# Patient Record
Sex: Male | Born: 1940 | Race: Black or African American | Hispanic: No | Smoking: Former smoker
Health system: Southern US, Community
[De-identification: ages and names within clinical notes are randomized; demographics above are authoritative.]

## PROBLEM LIST (undated history)

## (undated) DIAGNOSIS — I2510 Atherosclerotic heart disease of native coronary artery without angina pectoris: Secondary | ICD-10-CM

## (undated) DIAGNOSIS — J811 Chronic pulmonary edema: Secondary | ICD-10-CM

## (undated) DIAGNOSIS — I10 Essential (primary) hypertension: Secondary | ICD-10-CM

## (undated) DIAGNOSIS — H538 Other visual disturbances: Secondary | ICD-10-CM

## (undated) DIAGNOSIS — N189 Chronic kidney disease, unspecified: Secondary | ICD-10-CM

## (undated) DIAGNOSIS — I059 Rheumatic mitral valve disease, unspecified: Secondary | ICD-10-CM

## (undated) DIAGNOSIS — C61 Malignant neoplasm of prostate: Secondary | ICD-10-CM

## (undated) DIAGNOSIS — Z8619 Personal history of other infectious and parasitic diseases: Secondary | ICD-10-CM

## (undated) DIAGNOSIS — E119 Type 2 diabetes mellitus without complications: Secondary | ICD-10-CM

## (undated) DIAGNOSIS — I639 Cerebral infarction, unspecified: Secondary | ICD-10-CM

## (undated) DIAGNOSIS — I509 Heart failure, unspecified: Secondary | ICD-10-CM

## (undated) DIAGNOSIS — G4733 Obstructive sleep apnea (adult) (pediatric): Secondary | ICD-10-CM

## (undated) DIAGNOSIS — E785 Hyperlipidemia, unspecified: Secondary | ICD-10-CM

## (undated) DIAGNOSIS — I739 Peripheral vascular disease, unspecified: Secondary | ICD-10-CM

## (undated) HISTORY — DX: Obstructive sleep apnea (adult) (pediatric): G47.33

## (undated) HISTORY — PX: CATARACT EXTRACTION, BILATERAL: SHX1313

---

## 2016-06-13 ENCOUNTER — Encounter (HOSPITAL_BASED_OUTPATIENT_CLINIC_OR_DEPARTMENT_OTHER): Payer: Self-pay | Admitting: *Deleted

## 2016-06-13 ENCOUNTER — Emergency Department (HOSPITAL_BASED_OUTPATIENT_CLINIC_OR_DEPARTMENT_OTHER)
Admission: EM | Admit: 2016-06-13 | Discharge: 2016-06-13 | Disposition: A | Discharge status: Home / Self Care | Payer: PRIVATE HEALTH INSURANCE | Attending: Emergency Medicine | Admitting: Emergency Medicine

## 2016-06-13 DIAGNOSIS — Z79899 Other long term (current) drug therapy: Secondary | ICD-10-CM | POA: Diagnosis not present

## 2016-06-13 DIAGNOSIS — Z7982 Long term (current) use of aspirin: Secondary | ICD-10-CM | POA: Insufficient documentation

## 2016-06-13 DIAGNOSIS — R609 Edema, unspecified: Secondary | ICD-10-CM | POA: Diagnosis not present

## 2016-06-13 DIAGNOSIS — I10 Essential (primary) hypertension: Secondary | ICD-10-CM | POA: Diagnosis not present

## 2016-06-13 DIAGNOSIS — R0602 Shortness of breath: Secondary | ICD-10-CM | POA: Diagnosis present

## 2016-06-13 DIAGNOSIS — Z09 Encounter for follow-up examination after completed treatment for conditions other than malignant neoplasm: Secondary | ICD-10-CM | POA: Diagnosis not present

## 2016-06-13 HISTORY — DX: Hyperlipidemia, unspecified: E78.5

## 2016-06-13 HISTORY — DX: Peripheral vascular disease, unspecified: I73.9

## 2016-06-13 HISTORY — DX: Essential (primary) hypertension: I10

## 2016-06-13 HISTORY — DX: Chronic pulmonary edema: J81.1

## 2016-06-13 LAB — COMPREHENSIVE METABOLIC PANEL
ALK PHOS: 58 U/L (ref 38–126)
ALT: 16 U/L — AB (ref 17–63)
AST: 17 U/L (ref 15–41)
Albumin: 3.2 g/dL — ABNORMAL LOW (ref 3.5–5.0)
Anion gap: 8 (ref 5–15)
BILIRUBIN TOTAL: 0.5 mg/dL (ref 0.3–1.2)
BUN: 36 mg/dL — AB (ref 6–20)
CO2: 27 mmol/L (ref 22–32)
Calcium: 9.3 mg/dL (ref 8.9–10.3)
Chloride: 98 mmol/L — ABNORMAL LOW (ref 101–111)
Creatinine, Ser: 1.5 mg/dL — ABNORMAL HIGH (ref 0.61–1.24)
GFR calc Af Amer: 50 mL/min — ABNORMAL LOW (ref >60)
GFR calc non Af Amer: 43 mL/min — ABNORMAL LOW (ref >60)
GLUCOSE: 487 mg/dL — AB (ref 65–99)
Potassium: 4.9 mmol/L (ref 3.5–5.1)
Sodium: 133 mmol/L — ABNORMAL LOW (ref 135–145)
Total Protein: 7.6 g/dL (ref 6.5–8.1)

## 2016-06-13 LAB — CBC WITH DIFFERENTIAL/PLATELET
Basophils Absolute: 0 10*3/uL (ref 0.0–0.1)
Basophils Relative: 1 %
Eosinophils Absolute: 0.4 10*3/uL (ref 0.0–0.7)
Eosinophils Relative: 10 %
HEMATOCRIT: 35.8 % — AB (ref 39.0–52.0)
HEMOGLOBIN: 11.9 g/dL — AB (ref 13.0–17.0)
LYMPHS ABS: 0.9 10*3/uL (ref 0.7–4.0)
LYMPHS PCT: 20 %
MCH: 26.7 pg (ref 26.0–34.0)
MCHC: 33.2 g/dL (ref 30.0–36.0)
MCV: 80.4 fL (ref 78.0–100.0)
MONOS PCT: 10 %
Monocytes Absolute: 0.4 10*3/uL (ref 0.1–1.0)
NEUTROS ABS: 2.5 10*3/uL (ref 1.7–7.7)
NEUTROS PCT: 59 %
Platelets: 251 10*3/uL (ref 150–400)
RBC: 4.45 MIL/uL (ref 4.22–5.81)
RDW: 16.3 % — ABNORMAL HIGH (ref 11.5–15.5)
WBC: 4.2 10*3/uL (ref 4.0–10.5)

## 2016-06-13 NOTE — ED Provider Notes (Signed)
[facility] Provider Note   CSN: [id number] Arrival date & time: 06/13/16  1151     History   Chief Complaint Chief Complaint  Patient presents with  . Shortness of Breath    HPI Wayne Curtis is a 76 y.o. male.  HPI   In hospital in [place], needed to have follow up after hospitalization but does not have a PCP down here--family called and set appt with Dr. Harlan Stains as PCP but she could not see him until May and they are concerned he has issues to follow up with from hospital.  Recent hospital admission for htn, shortness of breath, fluid build up, not taking medications.  Did catheterization while in hospital that was ok.  Medications have helped with blood pressure, on low sodium, low cholesterol diet.    Family here for follow up as they could not see PCP until May. Report he has been improving at home.  Reports his leg swelling has decreased, he has no shortness of breath, no chest pain, and his mobility is improving. He reportedly has been walking with him, and initially he had difficulty getting around with a walker, but he is also now beginning to walk independently.  They report eating a balanced diet with spinach and fish.     Past Medical History:  Diagnosis Date  . Cancer ([place])   . Hyperlipidemia   . Hypertension   . Peripheral artery disease ([place])   . Pulmonary edema     There are no active problems to display for this patient.   History reviewed. No pertinent surgical history.     Home Medications    Prior to Admission medications   Medication Sig Start Date End Date Taking? Authorizing Provider  aspirin EC 81 MG tablet Take 81 mg by mouth daily.   Yes Historical Provider, MD  atorvastatin (LIPITOR) 40 MG tablet Take 40 mg by mouth daily.   Yes Historical Provider, MD  clopidogrel (PLAVIX) 75 MG tablet Take 75 mg by mouth daily.   Yes Historical Provider, MD  furosemide (LASIX) 40 MG tablet Take 40 mg by mouth.   Yes Historical  Provider, MD  labetalol (NORMODYNE) 200 MG tablet Take 200 mg by mouth 2 (two) times daily.   Yes Historical Provider, MD    Family History No family history on file.  Social History Social History  Substance Use Topics  . Smoking status: Never Smoker  . Smokeless tobacco: Never Used  . Alcohol use No     Allergies   Penicillins   Review of Systems Review of Systems  Constitutional: Negative for fever.  HENT: Negative for sore throat.   Eyes: Negative for visual disturbance.  Respiratory: Negative for shortness of breath.   Cardiovascular: Negative for chest pain.  Gastrointestinal: Negative for abdominal pain, nausea and vomiting.  Genitourinary: Negative for difficulty urinating.  Musculoskeletal: Negative for back pain and neck stiffness.  Skin: Negative for rash.  Neurological: Negative for syncope and headaches.     Physical Exam Updated Vital Signs BP (!) 167/73   Pulse 79   Temp 97.7 F (36.5 C) (Oral)   Resp (!) 22   Ht 5' 11.5" (1.816 m)   Wt 214 lb (97.1 kg)   SpO2 99%   BMI 29.43 kg/m   Physical Exam  Constitutional: He is oriented to person, place, and time. He appears well-developed and well-nourished. No distress.  HENT:  Head: Normocephalic and atraumatic.  Eyes: Conjunctivae and EOM are normal.  Neck: Normal range of motion. No JVD present.  Cardiovascular: Normal rate, regular rhythm, normal heart sounds and intact distal pulses.  Exam reveals no gallop and no friction rub.   No murmur heard. Pulmonary/Chest: Effort normal. No respiratory distress. He has decreased breath sounds (Left lower). He has no wheezes. He has no rales.  Abdominal: Soft. He exhibits no distension. There is no tenderness. There is no guarding.  Musculoskeletal: He exhibits edema (trace).  Neurological: He is alert and oriented to person, place, and time. He has normal strength. No cranial nerve deficit or sensory deficit.  Skin: Skin is warm and dry. He is not  diaphoretic.  Nursing note and vitals reviewed.    ED Treatments / Results  Labs (all labs ordered are listed, but only abnormal results are displayed) Labs Reviewed  CBC WITH DIFFERENTIAL/PLATELET - Abnormal; Notable for the following:       Result Value   Hemoglobin 11.9 (*)    HCT 35.8 (*)    RDW 16.3 (*)    All other components within normal limits  COMPREHENSIVE METABOLIC PANEL - Abnormal; Notable for the following:    Sodium 133 (*)    Chloride 98 (*)    Glucose, Bld 487 (*)    BUN 36 (*)    Creatinine, Ser 1.50 (*)    Albumin 3.2 (*)    ALT 16 (*)    GFR calc non Af Amer 43 (*)    GFR calc Af Amer 50 (*)    All other components within normal limits    EKG  EKG Interpretation  Date/Time:  Monday June 13 2016 12:18:51 EDT Ventricular Rate:  59 PR Interval:    QRS Duration: 149 QT Interval:  444 QTC Calculation: 440 R Axis:   -64 Text Interpretation:  Sinus rhythm Probable left atrial enlargement RBBB and LAFB Left ventricular hypertrophy T wave inversions anterior and infereior leads No prior ECG available Confirmed by [facility] MD, Junie Panning ([id number]) on 06/13/2016 12:48:23 PM      Prior EKG image not available, however EKG reading also reports left axis deviation, right bundle branch block, T-wave abnormalities laterally Radiology No results found.  Procedures Procedures (including critical care time)  Medications Ordered in ED Medications - No data to display   Prior Cr 1.28, 1.46 3/29    Initial Impression / Assessment and Plan / ED Course  I have reviewed the triage vital signs and the nursing notes.  Pertinent labs & imaging results that were available during my care of the patient were reviewed by me and considered in my medical decision making (see chart for details).    76 year old male with a history of hypertension, hyperlipidemia, peripheral artery disease, recent admission on March 20 with discharge on the 29th at [facility] in [place] with  concern for hypertensive emergency, pulmonary edema, rheumatic disease of the mitral valve, hyperlipidemia, peripheral artery disease, type 2 diabetes, acute kidney injury, with admission for diuresis, cardiac evaluation including catheterization who presents with desire for hospital admission follow up.  Family reports they tried to schedule with PCP however were unable to be seen until May, and given instructions to follow up with physician they presented to ED.  Per history, patient is improving since time of discharge from the hospital. It checked labs given new prescription for Lasix, which shows a normal potassium, hyperglycemia without signs of DKA, creatinine of 1.5 which is similar to creatinine at time of discharge on 3/29. Recommend continuing home medications, diet  and rehab at home and following up closely with PCP as scheduled.     Final Clinical Impressions(s) / ED Diagnoses   Final diagnoses:  Hospital discharge follow-up    New Prescriptions Discharge Medication List as of 06/13/2016  1:44 PM       Gareth Morgan, MD 06/13/16 1734

## 2016-06-13 NOTE — ED Triage Notes (Signed)
He was brought from [place] by his daughter a week ago after hospital admission for SOB and weakness. Hx of stroke in 2014.  Here today with SOB, pain in his legs and unable to care for himself.

## 2016-06-13 NOTE — ED Notes (Addendum)
Pt is alert and in no obvious distress. Family reports pt flew here from [place] approx 2 weeks ago.

## 2016-06-29 ENCOUNTER — Ambulatory Visit (INDEPENDENT_AMBULATORY_CARE_PROVIDER_SITE_OTHER): Payer: PRIVATE HEALTH INSURANCE | Admitting: Podiatry

## 2016-06-29 ENCOUNTER — Encounter: Payer: Self-pay | Admitting: Podiatry

## 2016-06-29 VITALS — BP 158/89 | HR 54

## 2016-06-29 DIAGNOSIS — B351 Tinea unguium: Secondary | ICD-10-CM

## 2016-06-29 DIAGNOSIS — E1149 Type 2 diabetes mellitus with other diabetic neurological complication: Secondary | ICD-10-CM

## 2016-06-29 DIAGNOSIS — M79676 Pain in unspecified toe(s): Secondary | ICD-10-CM

## 2016-06-29 NOTE — Progress Notes (Signed)
   Subjective:    Patient ID: Wayne Curtis, male    DOB: 1940/09/14, 76 y.o.   MRN: [id number]  HPI this patient presents the office with chief complaint of ingrowing toenails, both feet. He states that he is diabetic. He states that the nails are painful walking and wearing his shoes. He presents the office  for diabetic exam as well as nail debridement. He presents for preventative foot care services    Review of Systems  All other systems reviewed and are negative.      Objective:   Physical Exam GENERAL APPEARANCE: Alert, conversant. Appropriately groomed. No acute distress.  VASCULAR: Pedal pulses are  palpable at  [facility] and PT bilateral.  Capillary refill time WNL. NEUROLOGIC: sensation is normal to 5.07 monofilament at 5/5 sites bilateral.  Light touch is intact bilateral, Muscle strength normal.  MUSCULOSKELETAL: acceptable muscle strength, tone and stability bilateral.  Intrinsic muscluature intact bilateral.  Rectus appearance of foot and digits noted bilateral.   DERMATOLOGIC: skin color, texture, and turgor are within normal limits.  No preulcerative lesions or ulcers  are seen, no interdigital maceration noted.  No open lesions present.   No drainage noted.  NAILS  Thick disfigured discolored nails both feet.         Assessment & Plan:  Onychomycosis  B/L  Diabetes  IE  Debride nails    RTC 3 months.   Gardiner Barefoot DPM

## 2016-07-07 ENCOUNTER — Encounter (HOSPITAL_BASED_OUTPATIENT_CLINIC_OR_DEPARTMENT_OTHER): Payer: Self-pay | Admitting: *Deleted

## 2016-07-07 ENCOUNTER — Emergency Department (HOSPITAL_BASED_OUTPATIENT_CLINIC_OR_DEPARTMENT_OTHER): Payer: Medicare HMO

## 2016-07-07 ENCOUNTER — Observation Stay (HOSPITAL_BASED_OUTPATIENT_CLINIC_OR_DEPARTMENT_OTHER)
Admission: EM | Admit: 2016-07-07 | Discharge: 2016-07-09 | Disposition: A | Discharge status: Home Health | Payer: Medicare HMO | Attending: Family Medicine | Admitting: Family Medicine

## 2016-07-07 DIAGNOSIS — I2510 Atherosclerotic heart disease of native coronary artery without angina pectoris: Secondary | ICD-10-CM | POA: Diagnosis not present

## 2016-07-07 DIAGNOSIS — R739 Hyperglycemia, unspecified: Secondary | ICD-10-CM

## 2016-07-07 DIAGNOSIS — N183 Chronic kidney disease, stage 3 (moderate): Secondary | ICD-10-CM | POA: Insufficient documentation

## 2016-07-07 DIAGNOSIS — E1165 Type 2 diabetes mellitus with hyperglycemia: Secondary | ICD-10-CM | POA: Insufficient documentation

## 2016-07-07 DIAGNOSIS — E1151 Type 2 diabetes mellitus with diabetic peripheral angiopathy without gangrene: Secondary | ICD-10-CM | POA: Insufficient documentation

## 2016-07-07 DIAGNOSIS — Z794 Long term (current) use of insulin: Secondary | ICD-10-CM | POA: Insufficient documentation

## 2016-07-07 DIAGNOSIS — I130 Hypertensive heart and chronic kidney disease with heart failure and stage 1 through stage 4 chronic kidney disease, or unspecified chronic kidney disease: Secondary | ICD-10-CM | POA: Diagnosis not present

## 2016-07-07 DIAGNOSIS — Z923 Personal history of irradiation: Secondary | ICD-10-CM | POA: Insufficient documentation

## 2016-07-07 DIAGNOSIS — R001 Bradycardia, unspecified: Secondary | ICD-10-CM

## 2016-07-07 DIAGNOSIS — N390 Urinary tract infection, site not specified: Secondary | ICD-10-CM | POA: Diagnosis present

## 2016-07-07 DIAGNOSIS — N3000 Acute cystitis without hematuria: Secondary | ICD-10-CM

## 2016-07-07 DIAGNOSIS — I10 Essential (primary) hypertension: Secondary | ICD-10-CM

## 2016-07-07 DIAGNOSIS — I5022 Chronic systolic (congestive) heart failure: Secondary | ICD-10-CM | POA: Diagnosis not present

## 2016-07-07 DIAGNOSIS — R531 Weakness: Secondary | ICD-10-CM | POA: Insufficient documentation

## 2016-07-07 DIAGNOSIS — B9620 Unspecified Escherichia coli [E. coli] as the cause of diseases classified elsewhere: Secondary | ICD-10-CM | POA: Diagnosis not present

## 2016-07-07 DIAGNOSIS — Z8673 Personal history of transient ischemic attack (TIA), and cerebral infarction without residual deficits: Secondary | ICD-10-CM | POA: Insufficient documentation

## 2016-07-07 DIAGNOSIS — Z8546 Personal history of malignant neoplasm of prostate: Secondary | ICD-10-CM | POA: Insufficient documentation

## 2016-07-07 DIAGNOSIS — E1122 Type 2 diabetes mellitus with diabetic chronic kidney disease: Secondary | ICD-10-CM | POA: Diagnosis not present

## 2016-07-07 DIAGNOSIS — Z9114 Patient's other noncompliance with medication regimen: Secondary | ICD-10-CM | POA: Insufficient documentation

## 2016-07-07 DIAGNOSIS — I4510 Unspecified right bundle-branch block: Secondary | ICD-10-CM | POA: Diagnosis not present

## 2016-07-07 DIAGNOSIS — H8193 Unspecified disorder of vestibular function, bilateral: Secondary | ICD-10-CM | POA: Diagnosis not present

## 2016-07-07 DIAGNOSIS — R42 Dizziness and giddiness: Secondary | ICD-10-CM | POA: Diagnosis not present

## 2016-07-07 DIAGNOSIS — E440 Moderate protein-calorie malnutrition: Secondary | ICD-10-CM | POA: Insufficient documentation

## 2016-07-07 HISTORY — DX: Rheumatic mitral valve disease, unspecified: I05.9

## 2016-07-07 HISTORY — DX: Malignant neoplasm of prostate: C61

## 2016-07-07 HISTORY — DX: Heart failure, unspecified: I50.9

## 2016-07-07 HISTORY — DX: Other visual disturbances: H53.8

## 2016-07-07 HISTORY — DX: Personal history of other infectious and parasitic diseases: Z86.19

## 2016-07-07 HISTORY — DX: Cerebral infarction, unspecified: I63.9

## 2016-07-07 HISTORY — DX: Chronic kidney disease, unspecified: N18.9

## 2016-07-07 HISTORY — DX: Type 2 diabetes mellitus without complications: E11.9

## 2016-07-07 HISTORY — DX: Atherosclerotic heart disease of native coronary artery without angina pectoris: I25.10

## 2016-07-07 LAB — BASIC METABOLIC PANEL
Anion gap: 8 (ref 5–15)
BUN: 28 mg/dL — ABNORMAL HIGH (ref 6–20)
CHLORIDE: 100 mmol/L — AB (ref 101–111)
CO2: 26 mmol/L (ref 22–32)
Calcium: 9.6 mg/dL (ref 8.9–10.3)
Creatinine, Ser: 1.39 mg/dL — ABNORMAL HIGH (ref 0.61–1.24)
GFR, EST AFRICAN AMERICAN: 55 mL/min — AB (ref >60)
GFR, EST NON AFRICAN AMERICAN: 48 mL/min — AB (ref >60)
Glucose, Bld: 175 mg/dL — ABNORMAL HIGH (ref 65–99)
Potassium: 3.9 mmol/L (ref 3.5–5.1)
SODIUM: 134 mmol/L — AB (ref 135–145)

## 2016-07-07 LAB — CBC WITH DIFFERENTIAL/PLATELET
BASOS ABS: 0 10*3/uL (ref 0.0–0.1)
Basophils Relative: 1 %
Eosinophils Absolute: 0.5 10*3/uL (ref 0.0–0.7)
Eosinophils Relative: 10 %
HCT: 37.6 % — ABNORMAL LOW (ref 39.0–52.0)
Hemoglobin: 12.4 g/dL — ABNORMAL LOW (ref 13.0–17.0)
LYMPHS PCT: 21 %
Lymphs Abs: 1 10*3/uL (ref 0.7–4.0)
MCH: 26.6 pg (ref 26.0–34.0)
MCHC: 33 g/dL (ref 30.0–36.0)
MCV: 80.7 fL (ref 78.0–100.0)
Monocytes Absolute: 0.4 10*3/uL (ref 0.1–1.0)
Monocytes Relative: 8 %
NEUTROS ABS: 2.9 10*3/uL (ref 1.7–7.7)
Neutrophils Relative %: 60 %
PLATELETS: 229 10*3/uL (ref 150–400)
RBC: 4.66 MIL/uL (ref 4.22–5.81)
RDW: 16.3 % — AB (ref 11.5–15.5)
WBC: 4.9 10*3/uL (ref 4.0–10.5)

## 2016-07-07 LAB — URINALYSIS, ROUTINE W REFLEX MICROSCOPIC
BILIRUBIN URINE: NEGATIVE
Glucose, UA: 250 mg/dL — AB
KETONES UR: NEGATIVE mg/dL
NITRITE: POSITIVE — AB
Protein, ur: NEGATIVE mg/dL
Specific Gravity, Urine: 1.011 (ref 1.005–1.030)
pH: 5 (ref 5.0–8.0)

## 2016-07-07 LAB — URINALYSIS, MICROSCOPIC (REFLEX)

## 2016-07-07 LAB — CBG MONITORING, ED: Glucose-Capillary: 291 mg/dL — ABNORMAL HIGH (ref 65–99)

## 2016-07-07 LAB — GLUCOSE, CAPILLARY: GLUCOSE-CAPILLARY: 228 mg/dL — AB (ref 65–99)

## 2016-07-07 LAB — TROPONIN I

## 2016-07-07 MED ORDER — INSULIN ASPART 100 UNIT/ML ~~LOC~~ SOLN
0.0000 [IU] | Freq: Three times a day (TID) | SUBCUTANEOUS | Status: DC
  Administered 2016-07-08: 3 [IU] via SUBCUTANEOUS
  Administered 2016-07-08: 5 [IU] via SUBCUTANEOUS
  Administered 2016-07-08: 11 [IU] via SUBCUTANEOUS
  Administered 2016-07-09: 3 [IU] via SUBCUTANEOUS
  Administered 2016-07-09: 8 [IU] via SUBCUTANEOUS

## 2016-07-07 MED ORDER — ENSURE ENLIVE PO LIQD
237.0000 mL | Freq: Two times a day (BID) | ORAL | Status: DC
  Administered 2016-07-08: 237 mL via ORAL

## 2016-07-07 MED ORDER — CEPHALEXIN 250 MG PO CAPS: 500.0000 mg | ORAL_CAPSULE | Freq: Once | ORAL | Status: DC

## 2016-07-07 MED ORDER — CIPROFLOXACIN IN D5W 400 MG/200ML IV SOLN
400.0000 mg | Freq: Two times a day (BID) | INTRAVENOUS | Status: DC
  Administered 2016-07-07 – 2016-07-09 (×4): 400 mg via INTRAVENOUS
  Filled 2016-07-07 (×4): qty 200

## 2016-07-07 MED ORDER — ONDANSETRON HCL 4 MG PO TABS: 4.0000 mg | ORAL_TABLET | Freq: Four times a day (QID) | ORAL | Status: DC | PRN

## 2016-07-07 MED ORDER — LABETALOL HCL 200 MG PO TABS
200.0000 mg | ORAL_TABLET | Freq: Three times a day (TID) | ORAL | Status: DC
  Administered 2016-07-07: 200 mg via ORAL
  Filled 2016-07-07: qty 1

## 2016-07-07 MED ORDER — ACETAMINOPHEN 325 MG PO TABS: 650.0000 mg | ORAL_TABLET | Freq: Four times a day (QID) | ORAL | Status: DC | PRN

## 2016-07-07 MED ORDER — FUROSEMIDE 40 MG PO TABS
40.0000 mg | ORAL_TABLET | Freq: Every day | ORAL | Status: DC
  Administered 2016-07-08 – 2016-07-09 (×2): 40 mg via ORAL
  Filled 2016-07-07 (×2): qty 1

## 2016-07-07 MED ORDER — INSULIN GLARGINE 100 UNIT/ML ~~LOC~~ SOLN
10.0000 [IU] | Freq: Every day | SUBCUTANEOUS | Status: DC
  Administered 2016-07-07 – 2016-07-08 (×2): 10 [IU] via SUBCUTANEOUS
  Filled 2016-07-07 (×3): qty 0.1

## 2016-07-07 MED ORDER — ONDANSETRON HCL 4 MG/2ML IJ SOLN: 4.0000 mg | Freq: Four times a day (QID) | INTRAMUSCULAR | Status: DC | PRN

## 2016-07-07 MED ORDER — ATORVASTATIN CALCIUM 40 MG PO TABS
40.0000 mg | ORAL_TABLET | Freq: Every day | ORAL | Status: DC
  Administered 2016-07-07 – 2016-07-08 (×2): 40 mg via ORAL
  Filled 2016-07-07 (×2): qty 1

## 2016-07-07 MED ORDER — SODIUM CHLORIDE 0.9 % IV BOLUS (SEPSIS)
1000.0000 mL | Freq: Once | INTRAVENOUS | Status: AC
  Administered 2016-07-07: 1000 mL via INTRAVENOUS

## 2016-07-07 MED ORDER — SODIUM CHLORIDE 0.9 % IV SOLN
Freq: Once | INTRAVENOUS | Status: AC
  Administered 2016-07-07: 20:00:00 via INTRAVENOUS

## 2016-07-07 MED ORDER — CLOPIDOGREL BISULFATE 75 MG PO TABS
75.0000 mg | ORAL_TABLET | Freq: Every day | ORAL | Status: DC
  Administered 2016-07-08 – 2016-07-09 (×2): 75 mg via ORAL
  Filled 2016-07-07 (×2): qty 1

## 2016-07-07 MED ORDER — SODIUM CHLORIDE 0.9% FLUSH
3.0000 mL | Freq: Two times a day (BID) | INTRAVENOUS | Status: DC
  Administered 2016-07-08 – 2016-07-09 (×3): 3 mL via INTRAVENOUS

## 2016-07-07 MED ORDER — ACETAMINOPHEN 650 MG RE SUPP: 650.0000 mg | Freq: Four times a day (QID) | RECTAL | Status: DC | PRN

## 2016-07-07 MED ORDER — ASPIRIN EC 81 MG PO TBEC
81.0000 mg | DELAYED_RELEASE_TABLET | Freq: Every day | ORAL | Status: DC
  Administered 2016-07-08 – 2016-07-09 (×2): 81 mg via ORAL
  Filled 2016-07-07 (×2): qty 1

## 2016-07-07 MED ORDER — INSULIN ASPART 100 UNIT/ML ~~LOC~~ SOLN
0.0000 [IU] | Freq: Every day | SUBCUTANEOUS | Status: DC
  Administered 2016-07-07: 2 [IU] via SUBCUTANEOUS

## 2016-07-07 NOTE — ED Triage Notes (Signed)
Dizziness for a week. Worse today. Family states they did not know to give him Insulin. He has been without for about 3 weeks.

## 2016-07-07 NOTE — ED Notes (Signed)
Called [phone] to give report with no answer

## 2016-07-07 NOTE — ED Provider Notes (Signed)
[place] DEPT [facility] Provider Note   CSN: [id number] Arrival date & time: 07/07/16  1109     History   Chief Complaint Chief Complaint  Patient presents with  . Hyperglycemia    HPI Wayne Curtis is a 76 y.o. male who presents with dizziness, feeling off balance, and hyperglycemia. PMH significant for hx of CVA, CHF EF 50%, hx of prostate cancer, HLD, HTN, PAD s/p stents, CKD. Daughter is at bedside and helps provide history. The patient states he has felt intermittently dizzy and off balance for the past week. The dizziness has been mild up until yesterday when it started becoming more noticeable and he has almost had falls. He also has intermittent weakness alternating on the left side and right side. He dropped a plate this morning however he also has chronic right shoulder pain and weakness. The patient reports generalized weakness and fatigued. Daughter states that he was supposed to have physical therapy after he was released from the hospital in [place] however patient has a low motivation to do this and wants to lie in bed. Daughter also notes that she has been giving him all his PO medicines however they did not know he was on insulin so he has not taken this for the past 3 weeks. He also has mild shortness of breath but denies significant shortness of breath or leg swelling. He took his last dose of Lasix yesterday morning but has not taken it today. No fever or recent illnesses, URI symptoms, cough, chest pain, abdominal pain, nausea, vomiting, diarrhea. He has chronic issues with incontinence. He denies any leg pain or his legs giving out on him when he is walking. He does not feel lightheaded or like he is going to pass out. They have their first appointment with Dr. Dema Severin on May 11.  HPI  Past Medical History:  Diagnosis Date  . CHF (congestive heart failure) ([place])   . CKD (chronic kidney disease)   . Hyperlipidemia   . Hypertension   . Peripheral artery disease ([place])   .  Prostate cancer ([place])   . Pulmonary edema     There are no active problems to display for this patient.   History reviewed. No pertinent surgical history.     Home Medications    Prior to Admission medications   Medication Sig Start Date End Date Taking? Authorizing Provider  Insulin Aspart (NOVOLOG FLEXPEN Christopher) Inject into the skin.   Yes Historical Provider, MD  insulin glargine (LANTUS) 100 UNIT/ML injection Inject into the skin at bedtime.   Yes Historical Provider, MD  aspirin EC 81 MG tablet Take 81 mg by mouth daily.    Historical Provider, MD  atorvastatin (LIPITOR) 40 MG tablet Take 40 mg by mouth daily.    Historical Provider, MD  clopidogrel (PLAVIX) 75 MG tablet Take 75 mg by mouth daily.    Historical Provider, MD  furosemide (LASIX) 40 MG tablet Take 40 mg by mouth.    Historical Provider, MD  labetalol (NORMODYNE) 200 MG tablet Take 200 mg by mouth 2 (two) times daily.    Historical Provider, MD    Family History No family history on file.  Social History Social History  Substance Use Topics  . Smoking status: Never Smoker  . Smokeless tobacco: Never Used  . Alcohol use No     Allergies   Penicillins   Review of Systems Review of Systems  Constitutional: Positive for activity change and fatigue. Negative for fever.  Eyes:  Negative for visual disturbance.  Respiratory: Positive for shortness of breath. Negative for cough.   Cardiovascular: Negative for chest pain and leg swelling.  Gastrointestinal: Negative for abdominal pain, diarrhea, nausea and vomiting.  Genitourinary: Positive for enuresis. Negative for dysuria.  Musculoskeletal: Positive for arthralgias and gait problem.  Neurological: Positive for dizziness, weakness and headaches. Negative for syncope, light-headedness and numbness.  Psychiatric/Behavioral: Negative for confusion.  All other systems reviewed and are negative.    Physical Exam Updated Vital Signs BP (!) 141/64 (BP  Location: Right Arm)   Pulse 64   Temp 97.8 F (36.6 C)   Resp 16   Ht 5' 11.5" (1.816 m)   Wt 96.2 kg   SpO2 95%   BMI 29.16 kg/m   Physical Exam  Constitutional: He is oriented to person, place, and time. He appears well-developed and well-nourished. No distress.  Calm, cooperative, no acute distress  HENT:  Head: Normocephalic and atraumatic.  Right Ear: Hearing, tympanic membrane, external ear and ear canal normal.  Left Ear: Hearing, tympanic membrane, external ear and ear canal normal.  Tenderness behind right ear  Eyes: Conjunctivae are normal. Pupils are equal, round, and reactive to light. Right eye exhibits no discharge. Left eye exhibits no discharge. No scleral icterus.  Neck: Normal range of motion.  Cardiovascular: Normal rate and regular rhythm.  Exam reveals no gallop and no friction rub.   No murmur heard. Pulmonary/Chest: Effort normal and breath sounds normal. No respiratory distress. He has no wheezes. He has no rales. He exhibits no tenderness.  Abdominal: Soft. Bowel sounds are normal. He exhibits no distension and no mass. There is no tenderness. There is no rebound and no guarding. No hernia.  Musculoskeletal:  No leg swelling  Neurological: He is alert and oriented to person, place, and time.  Mental Status:  Alert, oriented, thought content appropriate, able to give a coherent history. Speech fluent without evidence of aphasia. Able to follow 2 step commands without difficulty.  Cranial Nerves:  II:  Peripheral visual fields grossly normal, pupils equal, round, reactive to light III,IV, VI: ptosis not present, extra-ocular motions intact bilaterally  V,VII: smile symmetric, facial light touch sensation equal VIII: hearing grossly normal to voice  X: uvula elevates symmetrically  XI: bilateral shoulder shrug symmetric and strong XII: midline tongue extension without fassiculations Motor:  Normal tone. 5/5 in upper and lower extremities bilaterally  including strong and equal grip strength and dorsiflexion/plantar flexion. Slightly weaker in the right upper extremity due to shoulder pain. Sensory: Pinprick and light touch normal in all extremities.  Cerebellar: normal finger-to-nose with bilateral upper extremities Gait: Unsteady  CV: distal pulses palpable throughout    Skin: Skin is warm and dry.  Psychiatric: He has a normal mood and affect. His behavior is normal.  Nursing note and vitals reviewed.    ED Treatments / Results  Labs (all labs ordered are listed, but only abnormal results are displayed) Labs Reviewed  BASIC METABOLIC PANEL - Abnormal; Notable for the following:       Result Value   Sodium 134 (*)    Chloride 100 (*)    Glucose, Bld 175 (*)    BUN 28 (*)    Creatinine, Ser 1.39 (*)    GFR calc non Af Amer 48 (*)    GFR calc Af Amer 55 (*)    All other components within normal limits  CBC WITH DIFFERENTIAL/PLATELET - Abnormal; Notable for the following:    Hemoglobin 12.4 (*)  HCT 37.6 (*)    RDW 16.3 (*)    All other components within normal limits  URINALYSIS, ROUTINE W REFLEX MICROSCOPIC - Abnormal; Notable for the following:    APPearance CLOUDY (*)    Glucose, UA 250 (*)    Hgb urine dipstick TRACE (*)    Nitrite POSITIVE (*)    Leukocytes, UA LARGE (*)    All other components within normal limits  URINALYSIS, MICROSCOPIC (REFLEX) - Abnormal; Notable for the following:    Bacteria, UA MANY (*)    Squamous Epithelial / LPF 0-5 (*)    All other components within normal limits  CBG MONITORING, ED - Abnormal; Notable for the following:    Glucose-Capillary 291 (*)    All other components within normal limits  URINE CULTURE  TROPONIN I    EKG  EKG Interpretation  Date/Time:  Thursday Jul 07 2016 14:06:15 EDT Ventricular Rate:  55 PR Interval:    QRS Duration: 156 QT Interval:  477 QTC Calculation: 457 R Axis:   -54 Text Interpretation:  Sinus rhythm Right bundle branch block LVH with  IVCD and secondary repol abnrm No significant change since last tracing Confirmed by [facility] MD, JULIE ([id number]) on 07/07/2016 2:27:33 PM Also confirmed by [facility] MD, JULIE ([id number]), editor Verna Czech [phone])  on 07/07/2016 2:28:54 PM       Radiology Dg Chest 2 View  Result Date: 07/07/2016 CLINICAL DATA:  Dizziness x3wks. Has been w/o insulin same time period. Pt states having problems w/balance. EXAM: CHEST  2 VIEW COMPARISON:  None. FINDINGS: Cardiac silhouette is normal in size. No mediastinal or hilar masses. No evidence of adenopathy. Clear lungs.  No pleural effusion or pneumothorax. Skeletal structures are demineralized but intact. A loop recorder lies in the left anterior chest wall. IMPRESSION: No acute cardiopulmonary disease. Electronically Signed   By: Lajean Manes M.D.   On: 07/07/2016 12:42   Ct Head Wo Contrast  Result Date: 07/07/2016 CLINICAL DATA:  Dizziness for 3 weeks. EXAM: CT HEAD WITHOUT CONTRAST TECHNIQUE: Contiguous axial images were obtained from the base of the skull through the vertex without intravenous contrast. COMPARISON:  None. FINDINGS: Brain: There is cortical atrophy and extensive chronic microvascular ischemic change. No evidence of acute intracranial abnormality including hemorrhage, infarct, mass lesion, mass effect, midline shift or abnormal extra-axial fluid collection. No hydrocephalus or pneumocephalus. Vascular: Extensive atherosclerotic vascular disease is identified. Skull: Intact. Sinuses/Orbits: Negative. Other: None. IMPRESSION: No acute abnormality. Atrophy and extensive chronic microvascular ischemic change. Atherosclerosis. Electronically Signed   By: Inge Rise M.D.   On: 07/07/2016 12:41    Procedures Procedures (including critical care time)  Medications Ordered in ED Medications  ciprofloxacin (CIPRO) IVPB 400 mg (400 mg Intravenous New Bag/Given 07/07/16 1542)  sodium chloride 0.9 % bolus 1,000 mL (0 mLs Intravenous Stopped 07/07/16  1521)     Initial Impression / Assessment and Plan / ED Course  I have reviewed the triage vital signs and the nursing notes.  Pertinent labs & imaging results that were available during my care of the patient were reviewed by me and considered in my medical decision making (see chart for details).  76 year old male with multiple medical problems presents with dizziness, confusion, and difficulty walking. CBC remarkable for mild anemia. BMP remarkable for mild hyponatremia and hypochloremia, hyperglycemia (175), and elevated SCr which is improved from last month. UA shows UTI with many bacteria, trace hgb, large leukocytes, positive nitrites, and TNTC WBC. Culture sent. EKG  was NSR and trop was 0. CXR negative. CT negative. Fluids and antibiotics started. Will admit to hospitalist for his AMS and UTI. Shared visit with Dr. Gilford Raid. Appreciated assistance from Dr. Cruzita Lederer.   Final Clinical Impressions(s) / ED Diagnoses   Final diagnoses:  Disequilibrium  Hypertension, unspecified type  Hyperglycemia  Acute cystitis without hematuria    New Prescriptions New Prescriptions   No medications on file     Recardo Evangelist, PA-C 07/07/16 [place], Julie, MD 07/14/16 [phone]

## 2016-07-07 NOTE — H&P (Signed)
History and Physical    Wayne Curtis KZS:[id number] DOB: 09/11/1940 DOA: 07/07/2016  PCP: Vidal Schwalbe, MD (Has not seen yet; Appointment May 11th) Former PCP in [place]: Dr. Elenora Gamma [phone] Last admitted to [facility]  Patient coming from: [facility]  Chief Complaint: Dizziness  HPI: Wayne Curtis is a 76 y.o. gentleman with a history of prostate cancer S/P radiation in 2014, CVA in 2014 (he denies residual deficits), PAD S/P bilateral LE stents, CAD, HTN, HLD, IDDM, and chronic systolic heart failure (Last EF by TTE 50%, 35-40% by TEE; likely nonischemic) who relocated to the [place] area to live with his daughter approximately three weeks ago.  He was admitted to the hospital in [place] in March for accelerated HTN with pulmonary edema and acute CHF.  His daughter brought him to [facility] after that to help him take care of himself.  He has an upcoming appointment with a new PCP next week.  He has been in his baseline state of health until three days ago.  He has had progressive weakness, dizziness, and disequilibrium.  His blood sugars and blood pressures have also been elevated.  No falls.  No vertigo.  No dysuria.  He has had chronic urinary urgency since his prostate treatment.  No acute changes in vision.  No chest pain or shortness of breath.  No abdominal pain, nausea, or vomiting.  Appetite has been normal, but he has lost 13 lbs in the past month.  Of note, the patient's daughter has several pages of medical records, which were reviewed by me at the bedside.  ED Course: U/A is consistent with infection with positive nitrites, large leukocytes, TNTC WBC, and many bacteria.  Urine culture sent.  Normal WBC count.  Sodium 134.  Chloride 100.  BUN 28.  Creatinine 1.3.  Troponin negative.   Head CT negative for acute process.  Chest xray negative for acute process.  He received a 1L NS and IV cipro.  Hospitalist asked to admit.  Review of Systems: As per HPI otherwise  10 systems reviewed and negative.   Past Medical History:  Diagnosis Date  . Blurred vision, left eye    Chronic  . CHF (congestive heart failure) ([place])   . Chronic kidney disease, stage 2 (mild)   . CKD (chronic kidney disease)   . Coronary artery disease   . Diabetes mellitus without complication ([place])   . History of shingles   . Hyperlipidemia   . Hypertension   . Peripheral artery disease ([place])   . Prostate cancer ([place])   . Prostate cancer ([place])   . Pulmonary edema   . Rheumatic mitral valve disease   . Stroke [facility])     Past Surgical History:  Procedure Laterality Date  . CATARACT EXTRACTION, BILATERAL       reports that he has never smoked. He has never used smokeless tobacco. He reports that he does not drink alcohol. His drug history is not on file. No illicit drug use. He is single.  Allergies  Allergen Reactions  . Penicillins     Family History  Problem Relation Age of Onset  . Pancreatic cancer Sister   . Stomach cancer Brother   . Benign prostatic hyperplasia Brother   . Throat cancer Brother      Prior to Admission medications   Medication Sig Start Date End Date Taking? Authorizing Provider  aspirin EC 81 MG tablet Take 81 mg by mouth daily at 12 noon.  Yes Historical Provider, MD  atorvastatin (LIPITOR) 40 MG tablet Take 40 mg by mouth at bedtime.    Yes Historical Provider, MD  clopidogrel (PLAVIX) 75 MG tablet Take 75 mg by mouth daily.   Yes Historical Provider, MD  furosemide (LASIX) 40 MG tablet Take 40 mg by mouth daily.    Yes Historical Provider, MD  Insulin Aspart (NOVOLOG FLEXPEN [place]) Inject into the skin 3 (three) times daily before meals.    Yes Historical Provider, MD  insulin glargine (LANTUS) 100 UNIT/ML injection Inject into the skin at bedtime.   Yes Historical Provider, MD  labetalol (NORMODYNE) 200 MG tablet Take 200 mg by mouth 3 (three) times daily.     Historical Provider, MD    Physical Exam: Vitals:   07/07/16 1552  07/07/16 1700 07/07/16 1825 07/07/16 2021  BP: (!) 164/64 (!) 163/64 (!) 175/79 (!) 164/45  Pulse: (!) 49 (!) 51 (!) 56   Resp: 19 17  20   Temp:   97.9 F (36.6 C) 97.6 F (36.4 C)  TempSrc:   Oral Oral  SpO2: 98% 99%  100%  Weight:   90.4 kg (199 lb 4.7 oz)   Height:   5' 11.5" (1.816 m)       Constitutional: NAD, calm, comfortable Vitals:   07/07/16 1552 07/07/16 1700 07/07/16 1825 07/07/16 2021  BP: (!) 164/64 (!) 163/64 (!) 175/79 (!) 164/45  Pulse: (!) 49 (!) 51 (!) 56   Resp: 19 17  20   Temp:   97.9 F (36.6 C) 97.6 F (36.4 C)  TempSrc:   Oral Oral  SpO2: 98% 99%  100%  Weight:   90.4 kg (199 lb 4.7 oz)   Height:   5' 11.5" (1.816 m)    Eyes: PERRL, lids and conjunctivae normal ENMT: Mucous membranes are moist. Posterior pharynx clear of any exudate or lesions. Normal dentition.  Neck: normal appearance, supple, no masses Respiratory: clear to auscultation bilaterally, no wheezing, no crackles. Normal respiratory effort. No accessory muscle use.  Cardiovascular: Normal rate, regular rhythm, no murmurs / rubs / gallops. No extremity edema. 2+ posterior tibial pulses. No carotid bruits.  GI: abdomen is soft and compressible.  No distention.  No tenderness.  Bowel sounds are present. Musculoskeletal:  No joint deformity in upper and lower extremities. Good ROM, no contractures. Normal muscle tone.  No CVA tenderness bilaterally. Skin: no rashes, warm and dry Neurologic: CN 2-12 grossly intact. Sensation intact, Strength symmetric bilaterally, 5/5. Psychiatric: Normal judgment and insight. Alert and oriented x 3. Normal mood.     Labs on Admission: I have personally reviewed following labs and imaging studies  CBC:  Recent Labs Lab 07/07/16 1241  WBC 4.9  NEUTROABS 2.9  HGB 12.4*  HCT 37.6*  MCV 80.7  PLT 193   Basic Metabolic Panel:  Recent Labs Lab 07/07/16 1241  NA 134*  K 3.9  CL 100*  CO2 26  GLUCOSE 175*  BUN 28*  CREATININE 1.39*  CALCIUM  9.6   GFR: Estimated Creatinine Clearance: 48.9 mL/min (A) (by C-G formula based on SCr of 1.39 mg/dL (H)).  Cardiac Enzymes:  Recent Labs Lab 07/07/16 1241  TROPONINI <0.03   CBG:  Recent Labs Lab 07/07/16 1133  GLUCAP 291*   Urine analysis:    Component Value Date/Time   COLORURINE YELLOW 07/07/2016 1425   APPEARANCEUR CLOUDY (A) 07/07/2016 1425   LABSPEC 1.011 07/07/2016 1425   PHURINE 5.0 07/07/2016 1425   GLUCOSEU 250 (A) 07/07/2016 1425  HGBUR TRACE (A) 07/07/2016 1425   BILIRUBINUR NEGATIVE 07/07/2016 1425   [place] 07/07/2016 1425   PROTEINUR NEGATIVE 07/07/2016 1425   NITRITE POSITIVE (A) 07/07/2016 1425   LEUKOCYTESUR LARGE (A) 07/07/2016 1425    Radiological Exams on Admission: Dg Chest 2 View  Result Date: 07/07/2016 CLINICAL DATA:  Dizziness x3wks. Has been w/o insulin same time period. Pt states having problems w/balance. EXAM: CHEST  2 VIEW COMPARISON:  None. FINDINGS: Cardiac silhouette is normal in size. No mediastinal or hilar masses. No evidence of adenopathy. Clear lungs.  No pleural effusion or pneumothorax. Skeletal structures are demineralized but intact. A loop recorder lies in the left anterior chest wall. IMPRESSION: No acute cardiopulmonary disease. Electronically Signed   By: Lajean Manes M.D.   On: 07/07/2016 12:42   Ct Head Wo Contrast  Result Date: 07/07/2016 CLINICAL DATA:  Dizziness for 3 weeks. EXAM: CT HEAD WITHOUT CONTRAST TECHNIQUE: Contiguous axial images were obtained from the base of the skull through the vertex without intravenous contrast. COMPARISON:  None. FINDINGS: Brain: There is cortical atrophy and extensive chronic microvascular ischemic change. No evidence of acute intracranial abnormality including hemorrhage, infarct, mass lesion, mass effect, midline shift or abnormal extra-axial fluid collection. No hydrocephalus or pneumocephalus. Vascular: Extensive atherosclerotic vascular disease is identified. Skull:  Intact. Sinuses/Orbits: Negative. Other: None. IMPRESSION: No acute abnormality. Atrophy and extensive chronic microvascular ischemic change. Atherosclerosis. Electronically Signed   By: Inge Rise M.D.   On: 07/07/2016 12:41    EKG: Independently reviewed. Sinus bradycardia.  RBBB is not new.  Assessment/Plan Principal Problem:   UTI (urinary tract infection) Active Problems:   Disequilibrium   Hyperglycemia   Acute cystitis without hematuria   Hypertension       UTI, likely acute weakness, dizziness, and gait disturbance.  Bradycardia also noted, and CVA is certainly on the differential. --Continue IV cipro --Urine culture pending --Blood cultures for fever greater than 100.4 --Fall precautions --PT eval and treat --Consider MRI brain if patient not improved with treatment of UTI  Sinus bradycardia --Decrease labetalol dose now --Telemetry monitoring  Accelerated HTN in AAM patient with CHF --Start low dose hydralazine and isosorbide dinitrate TID --Avoiding ARB due to renal insufficiency for now  CKD 3 --Stable --Avoid nephrotoxic agents  Chronic systolic heart failure --Compensated --Lasix 40mg  po daily  IDDM, history of noncompliance with insulin.  A1c in March was 8. --Lantus 10 units qHS --SSI coverage TID with meals  History of CAD (nonobstructing, 60% LAD lesion, among others, noted on LHC in March), history of prior CVA --ASA, plavix, statin     DVT prophylaxis: SCDs Code Status: FULL Family Communication: Daughter and granddaughter present at bedside at time of admission. Disposition Plan: Expect he will go home at discharge. Consults called: NONE Admission status: Place in observation, telemetry.   TIME SPENT: 70 minutes   Eber Jones MD [facility] Pager [phone]  If 7PM-7AM, please contact night-coverage [email or web address] Password [email or web address]  07/07/2016, 8:59 PM

## 2016-07-07 NOTE — Progress Notes (Signed)
Called by Dr. Gilford Raid regarding Mr. Wayne Curtis, CVA, CHF, from [place], EF 50%,prostate Ca,, HTN, HLD, PAD, CKD, here with confusion UTI, unable to walk.   RN, on patient arrival, please call [phone] and let patient placement RN know of the patient's arrival and a hospitalist will be assigned to admit the patient.   Costin M. Cruzita Lederer, MD [facility] [phone]  If 7PM-7AM, please contact night-coverage [email or web address] Password [facility]  04/15/2016, 5:37 AM

## 2016-07-08 DIAGNOSIS — R001 Bradycardia, unspecified: Secondary | ICD-10-CM | POA: Diagnosis not present

## 2016-07-08 DIAGNOSIS — R739 Hyperglycemia, unspecified: Secondary | ICD-10-CM | POA: Diagnosis not present

## 2016-07-08 DIAGNOSIS — E119 Type 2 diabetes mellitus without complications: Secondary | ICD-10-CM | POA: Diagnosis not present

## 2016-07-08 DIAGNOSIS — N183 Chronic kidney disease, stage 3 (moderate): Secondary | ICD-10-CM | POA: Diagnosis not present

## 2016-07-08 DIAGNOSIS — N390 Urinary tract infection, site not specified: Secondary | ICD-10-CM | POA: Diagnosis not present

## 2016-07-08 DIAGNOSIS — I10 Essential (primary) hypertension: Secondary | ICD-10-CM | POA: Diagnosis not present

## 2016-07-08 DIAGNOSIS — I5022 Chronic systolic (congestive) heart failure: Secondary | ICD-10-CM | POA: Diagnosis not present

## 2016-07-08 DIAGNOSIS — Z794 Long term (current) use of insulin: Secondary | ICD-10-CM

## 2016-07-08 LAB — CBC
HEMATOCRIT: 33 % — AB (ref 39.0–52.0)
Hemoglobin: 10.9 g/dL — ABNORMAL LOW (ref 13.0–17.0)
MCH: 26.6 pg (ref 26.0–34.0)
MCHC: 33 g/dL (ref 30.0–36.0)
MCV: 80.5 fL (ref 78.0–100.0)
Platelets: 207 10*3/uL (ref 150–400)
RBC: 4.1 MIL/uL — ABNORMAL LOW (ref 4.22–5.81)
RDW: 16.2 % — AB (ref 11.5–15.5)
WBC: 5.5 10*3/uL (ref 4.0–10.5)

## 2016-07-08 LAB — BASIC METABOLIC PANEL
Anion gap: 6 (ref 5–15)
BUN: 20 mg/dL (ref 6–20)
CALCIUM: 8.9 mg/dL (ref 8.9–10.3)
CO2: 25 mmol/L (ref 22–32)
CREATININE: 1.26 mg/dL — AB (ref 0.61–1.24)
Chloride: 106 mmol/L (ref 101–111)
GFR calc Af Amer: >60 mL/min (ref >60)
GFR calc non Af Amer: 54 mL/min — ABNORMAL LOW (ref >60)
GLUCOSE: 221 mg/dL — AB (ref 65–99)
Potassium: 4.2 mmol/L (ref 3.5–5.1)
Sodium: 137 mmol/L (ref 135–145)

## 2016-07-08 LAB — GLUCOSE, CAPILLARY
Glucose-Capillary: 198 mg/dL — ABNORMAL HIGH (ref 65–99)
Glucose-Capillary: 309 mg/dL — ABNORMAL HIGH (ref 65–99)
Glucose-Capillary: 215 mg/dL — ABNORMAL HIGH (ref 65–99)
Glucose-Capillary: 148 mg/dL — ABNORMAL HIGH (ref 65–99)

## 2016-07-08 MED ORDER — HYDRALAZINE HCL 25 MG PO TABS
25.0000 mg | ORAL_TABLET | Freq: Three times a day (TID) | ORAL | Status: DC
  Administered 2016-07-08 – 2016-07-09 (×5): 25 mg via ORAL
  Filled 2016-07-08 (×5): qty 1

## 2016-07-08 MED ORDER — ISOSORBIDE DINITRATE 10 MG PO TABS
10.0000 mg | ORAL_TABLET | Freq: Three times a day (TID) | ORAL | Status: DC
  Administered 2016-07-08 – 2016-07-09 (×5): 10 mg via ORAL
  Filled 2016-07-08 (×6): qty 1

## 2016-07-08 MED ORDER — LABETALOL HCL 200 MG PO TABS
200.0000 mg | ORAL_TABLET | Freq: Two times a day (BID) | ORAL | Status: DC
  Administered 2016-07-08 – 2016-07-09 (×3): 200 mg via ORAL
  Filled 2016-07-08 (×3): qty 1

## 2016-07-08 MED ORDER — BISACODYL 10 MG RE SUPP
10.0000 mg | Freq: Every day | RECTAL | Status: DC | PRN
  Administered 2016-07-08: 10 mg via RECTAL
  Filled 2016-07-08: qty 1

## 2016-07-08 MED ORDER — PREMIER PROTEIN SHAKE
11.0000 [oz_av] | Freq: Two times a day (BID) | ORAL | Status: DC
  Administered 2016-07-08 – 2016-07-09 (×3): 11 [oz_av] via ORAL
  Filled 2016-07-08 (×3): qty 325.31

## 2016-07-08 NOTE — Progress Notes (Signed)
Initial Nutrition Assessment  DOCUMENTATION CODES:   Non-severe (moderate) malnutrition in context of acute illness/injury  INTERVENTION:  - Will d/c Ensure Enlive and order Premier Protein BID, each supplement provides 160 kcal and 30 grams of protein.  - Continue to encourage PO intakes of meals and supplements. - RD will continue to monitor for additional needs.  NUTRITION DIAGNOSIS:   Malnutrition (moderate/non-severe) related to acute illness (hyperglycemia and elevated BP) as evidenced by mild depletion of body fat, mild depletion of muscle mass, percent weight loss.  GOAL:   Patient will meet greater than or equal to 90% of their needs  MONITOR:   PO intake, Supplement acceptance, Weight trends, Labs  REASON FOR ASSESSMENT:   Malnutrition Screening Tool  ASSESSMENT:   76 y.o. gentleman with a history of prostate cancer S/P radiation in 2014, CVA in 2014 (he denies residual deficits), PAD S/P bilateral LE stents, CAD, HTN, HLD, IDDM, and chronic systolic heart failure (Last EF by TTE 50%, 35-40% by TEE; likely nonischemic) who relocated to the [place] area to live with his daughter approximately three weeks ago.  He was admitted to the hospital in [place] in March for accelerated HTN with pulmonary edema and acute CHF.  His daughter brought him to [facility] after that to help him take care of himself.  He has an upcoming appointment with a new PCP next week.  He has been in his baseline state of health until three days ago.  He has had progressive weakness, dizziness, and disequilibrium.  His blood sugars and blood pressures have also been elevated. No abdominal pain, nausea, or vomiting.  Appetite has been normal, but he has lost 13 lbs in the past month.  Pt seen for MST. BMI indicates overweight status, appropriate for age. No intakes documented since admission but pt reports he ate most of lunch which consisted of baked fish, a side salad, green beans, and baked sweet potato.  Pt denies any chewing or swallowing difficulties with any foods and denies any abdominal pain or nausea with intakes PTA or since admission. As outlined in H&P above, pt also reported to this RD about hospitalization x10 days in [place] in March and that he moved to [place] ~1 month ago to live with his daughter. He states that prior to the move he was feeling SOB and generally unwell but that this did not affect his appetite. Since moving, his daughter provides breakfast and dinner for him daily and sometimes he goes out to eat for meals. Pt denies any changes in appetite or intakes since moving to [place].   Physical assessment shows mild muscle and mild fat wasting to upper body; lower body not assessed at this time. Pt reports 13 lb weight loss in the past 1 month; chart review indicates 15 lb weight loss (7% body weight) in the past 1 month which is significant for time frame. Suspect weight loss may be 2/2 prolonged hyperglycemia.   Medications reviewed; 40 mg oral Lasix/day, sliding scale Novolog, 10 units Lantus/day. Labs reviewed; CBGs: 198 and 309 mg/dL today, creatinine: 1.26 mg/dL.  IVF: NS @ 100 mL/hr.    Diet Order:  Diet heart healthy/carb modified Room service appropriate? Yes; Fluid consistency: Thin  Skin:  Reviewed, no issues  Last BM:  5/2 (PTA)  Height:   Ht Readings from Last 1 Encounters:  07/07/16 5' 11.5" (1.816 m)    Weight:   Wt Readings from Last 1 Encounters:  07/07/16 199 lb 4.7 oz (90.4 kg)  Ideal Body Weight:  79.55 kg  BMI:  Body mass index is 27.41 kg/m.  Estimated Nutritional Needs:   Kcal:  1625-1810 (18-20 kcal/kg)  Protein:  72-90 grams (0.8-1 grams/kg)  Fluid:  1.6-1.8 L/day  EDUCATION NEEDS:   No education needs identified at this time    Jarome Matin, MS, RD, LDN, CNSC Inpatient Clinical Dietitian Pager # [phone] After hours/weekend pager # [phone]

## 2016-07-08 NOTE — Evaluation (Signed)
Physical Therapy Evaluation Patient Details Name: Wayne Curtis MRN: [id number] DOB: Jul 24, 1940 Today's Date: 07/08/2016   History of Present Illness  76 yo male admitted with UTi, hyperglycemia, dizzness. Hx of CVA, CHA, prostate ca, PAD, CKD, shingles  Clinical Impression  On eval, pt required Min assist for mobility. He was able to perform a stand pivot to get from bed to recliner. Unable to ambulate pt due to catheter leaking continuously during session-made NT aware. Recommend HHPT as long as family can provide current level of care. No family present during session. Recommend nursing walk with pt in hallway with RW prior to d/c if pt should be discharged soon. Will follow.     Follow Up Recommendations Home health PT;Supervision/Assistance - 24 hour    Equipment Recommendations  None recommended by PT    Recommendations for Other Services       Precautions / Restrictions Precautions Precautions: Fall Restrictions Weight Bearing Restrictions: No      Mobility  Bed Mobility Overal bed mobility: Needs Assistance Bed Mobility: Supine to Sit     Supine to sit: Min guard     General bed mobility comments: close guard for safety  Transfers Overall transfer level: Needs assistance Equipment used: Rolling walker (2 wheeled) Transfers: Sit to/from Stand  Min assist         General transfer comment: x2. Noted pt bed soaked with urine. changed gown. assisted pt to recliner. deferred ambulatio due to continuous leaking of catheter-made NT aware.  Ambulation/Gait                Stairs            Wheelchair Mobility    Modified Rankin (Stroke Patients Only)       Balance Overall balance assessment: Needs assistance           Standing balance-Leahy Scale: Fair                               Pertinent Vitals/Pain Pain Assessment: Faces Faces Pain Scale: Hurts even more Pain Location: L shoulder Pain Descriptors / Indicators:  Aching Pain Intervention(s): Monitored during session    Home Living Family/patient expects to be discharged to:: Private residence Living Arrangements: Children Available Help at Discharge: Family Type of Home: Apartment Home Access: Stairs to enter   Technical brewer of Steps: 1 flight Home Layout: One level Home Equipment: Environmental consultant - 2 wheels      Prior Function Level of Independence: Independent with assistive device(s)               Hand Dominance        Extremity/Trunk Assessment   Upper Extremity Assessment Upper Extremity Assessment: Generalized weakness    Lower Extremity Assessment Lower Extremity Assessment: Generalized weakness    Cervical / Trunk Assessment Cervical / Trunk Assessment: Normal  Communication   Communication: No difficulties  Cognition Arousal/Alertness: Awake/alert Behavior During Therapy: WFL for tasks assessed/performed Overall Cognitive Status: Within Functional Limits for tasks assessed                                        General Comments      Exercises     Assessment/Plan    PT Assessment Patient needs continued PT services  PT Problem List Decreased strength;Decreased mobility;Decreased balance;Decreased activity tolerance;Decreased knowledge of use of  DME;Pain       PT Treatment Interventions DME instruction;Gait training;Therapeutic activities;Therapeutic exercise;Patient/family education;Balance training;Functional mobility training    PT Goals (Current goals can be found in the Care Plan section)  Acute Rehab PT Goals Patient Stated Goal: none stated PT Goal Formulation: With patient Time For Goal Achievement: 07/22/16 Potential to Achieve Goals: Good    Frequency Min 3X/week   Barriers to discharge        Co-evaluation               AM-PAC PT "6 Clicks" Daily Activity  Outcome Measure Difficulty turning over in bed (including adjusting bedclothes, sheets and blankets)?:  A Little Difficulty moving from lying on back to sitting on the side of the bed? : A Little Difficulty sitting down on and standing up from a chair with arms (e.g., wheelchair, bedside commode, etc,.)?: A Little Help needed moving to and from a bed to chair (including a wheelchair)?: A Little Help needed walking in hospital room?: A Little Help needed climbing 3-5 steps with a railing? : A Little 6 Click Score: 18    End of Session   Activity Tolerance: Patient tolerated treatment well Patient left: in chair;with call bell/phone within reach;with chair alarm set   PT Visit Diagnosis: Muscle weakness (generalized) (M62.81);Difficulty in walking, not elsewhere classified (R26.2)    Time: 0946-1000 PT Time Calculation (min) (ACUTE ONLY): 14 min   Charges:   PT Evaluation $PT Eval Low Complexity: 1 Procedure     PT G Codes:   PT G-Codes **NOT FOR INPATIENT CLASS** Functional Assessment Tool Used: AM-PAC 6 Clicks Basic Mobility;Clinical judgement Functional Limitation: Mobility: Walking and moving around Mobility: Walking and Moving Around Current Status (M6294): At least 1 percent but less than 20 percent impaired, limited or restricted Mobility: Walking and Moving Around Goal Status [phone]): At least 1 percent but less than 20 percent impaired, limited or restricted      Weston Anna, MPT Pager: [phone]

## 2016-07-08 NOTE — Care Management Note (Signed)
19 y/Case Management Note  Patient Details  Name: Joquan Lotz MRN: [id number] Date of Birth: 02/02/1941  Subjective/Objective:75 y/o m admitted w/UTI. From home. PT-recc HHPT. Patient provided w/HHC agency list-defers to dtr Sonya who chose initially [facility](OON),the checked [facility]-very high co pays, lastly [facility]-rep [facility]-able to accept-dtr /patient agree. No further CM needs.                    Action/Plan:d/c home w/HHC.   Expected Discharge Date:   (unknown)               Expected Discharge Plan:  [place]  In-House Referral:     Discharge planning Services  CM Consult  Post Acute Care Choice:    Choice offered to:  Patient  DME Arranged:    DME Agency:     HH Arranged:  PT, OT HH Agency:  [facility]  Status of Service:  In process, will continue to follow  If discussed at Long Length of Stay Meetings, dates discussed:    Additional Comments:  Dessa Phi, RN 07/08/2016, 3:41 PM

## 2016-07-08 NOTE — Care Management Obs Status (Signed)
[place] NOTIFICATION   Patient Details  Name: Wayne Curtis MRN: [id number] Date of Birth: 10-10-1940   Medicare Observation Status Notification Given:  Yes    MahabirJuliann Pulse, RN 07/08/2016, 3:03 PM

## 2016-07-08 NOTE — Progress Notes (Signed)
PROGRESS NOTE [facility]   Wayne Curtis   TKP:[id number] DOB: [birth year]  DOA: 07/07/2016 PCP: Vidal Schwalbe, MD   Brief Narrative:  76 year old male with medical history of prostate cancer status post radiation in 2014, CVA in 2014 with no residual deficit, CAD hypertension, insulin-dependent diabetes mellitus and congestive heart failure presented to the emergency department complaining of dizziness and bilateral lower extremity weakness. Patient was found to have UA grossly contaminated consistent with UTI. Patient was admitted for IV antibiotics and further management.  Subjective: Seen and examined, have no ne complaints this AM. He reports that he is feeling better than yesterday. Denies abdominal pain, dysuria, nausea and vomiting, chest pain and shortness of breath.  Assessment & Plan: UTI - initially with weakness and dizziness which has improved UA grossly abnormal with positive nitrates Patient on IV Cipro will continue for now Urine culture pending - if IDUC is back by the AM will d/c in AM  No blood cultures were drawn  Sinus bradycardia - resolved after decreasing dose of labetalol Continue to monitor   CKD stage 3  Cr stable  Avoid nephrotoxic agents  Monitor BMP   Chronic systolic CHF  Seems to be compensated  Continue current regimen   DM type 2 insulin dependent  A1C 8 in march  Continue Lantus and SSI  Monitor CBG    DVT prophylaxis: SCD's  Code Status: FULL  Family Communication: Daughter via phone  Disposition Plan: Home with [facility] in 24-48 hrs   Consultants:   None   Procedures:   None   Antimicrobials: Anti-infectives    Start     Dose/Rate Route Frequency Ordered Stop   07/07/16 1530  cephALEXin (KEFLEX) capsule 500 mg  Status:  Discontinued     500 mg Oral  Once 07/07/16 1519 07/07/16 1522   07/07/16 1530  ciprofloxacin (CIPRO) IVPB 400 mg     400 mg 200 mL/hr over 60 Minutes Intravenous Every 12 hours 07/07/16 1522           Objective: Vitals:   07/07/16 1825 07/07/16 2021 07/08/16 0437 07/08/16 1052  BP: (!) 175/79 (!) 164/45 (!) 133/53 135/60  Pulse: (!) 56  67   Resp:  20 20   Temp: 97.9 F (36.6 C) 97.6 F (36.4 C) 98 F (36.7 C)   TempSrc: Oral Oral Oral   SpO2:  100% 96%   Weight: 90.4 kg (199 lb 4.7 oz)     Height: 5' 11.5" (1.816 m)       Intake/Output Summary (Last 24 hours) at 07/08/16 1229 Last data filed at 07/08/16 1109  Gross per 24 hour  Intake             2205 ml  Output             1300 ml  Net              905 ml   Filed Weights   07/07/16 1118 07/07/16 1825  Weight: 96.2 kg (212 lb) 90.4 kg (199 lb 4.7 oz)    Examination:  General exam: Appears calm and comfortable  HEENT: AC/AT, PERRLA, OP moist and clear Respiratory system: Clear to auscultation. No wheezes,crackle or rhonchi Cardiovascular system: S1 & S2 heard, RRR. No JVD, murmurs, rubs or gallops Gastrointestinal system: Abdomen is nondistended, soft and nontender. No organomegaly or masses felt. Normal bowel sounds heard. Central nervous system: Alert and oriented. No focal neurological deficits. Extremities: No pedal edema. Symmetric, strength 5/5  Skin: No rashes, lesions or ulcers Psychiatry: Judgement and insight appear normal. Mood & affect appropriate.   Data Reviewed: I have personally reviewed following labs and imaging studies  CBC:  Recent Labs Lab 07/07/16 1241 07/08/16 0618  WBC 4.9 5.5  NEUTROABS 2.9  --   HGB 12.4* 10.9*  HCT 37.6* 33.0*  MCV 80.7 80.5  PLT 229 824   Basic Metabolic Panel:  Recent Labs Lab 07/07/16 1241 07/08/16 0618  NA 134* 137  K 3.9 4.2  CL 100* 106  CO2 26 25  GLUCOSE 175* 221*  BUN 28* 20  CREATININE 1.39* 1.26*  CALCIUM 9.6 8.9   GFR: Estimated Creatinine Clearance: 54 mL/min (A) (by C-G formula based on SCr of 1.26 mg/dL (H)). Liver Function Tests: No results for input(s): AST, ALT, ALKPHOS, BILITOT, PROT, ALBUMIN in the last 168 hours. No  results for input(s): LIPASE, AMYLASE in the last 168 hours. No results for input(s): AMMONIA in the last 168 hours. Coagulation Profile: No results for input(s): INR, PROTIME in the last 168 hours. Cardiac Enzymes:  Recent Labs Lab 07/07/16 1241  TROPONINI <0.03    CBG:  Recent Labs Lab 07/07/16 1133 07/07/16 2322 07/08/16 0744 07/08/16 1209  GLUCAP 291* 228* 198* 309*    No results found for this or any previous visit (from the past 240 hour(s)).    Radiology Studies: Dg Chest 2 View  Result Date: 07/07/2016 CLINICAL DATA:  Dizziness x3wks. Has been w/o insulin same time period. Pt states having problems w/balance. EXAM: CHEST  2 VIEW COMPARISON:  None. FINDINGS: Cardiac silhouette is normal in size. No mediastinal or hilar masses. No evidence of adenopathy. Clear lungs.  No pleural effusion or pneumothorax. Skeletal structures are demineralized but intact. A loop recorder lies in the left anterior chest wall. IMPRESSION: No acute cardiopulmonary disease. Electronically Signed   By: Lajean Manes M.D.   On: 07/07/2016 12:42   Ct Head Wo Contrast  Result Date: 07/07/2016 CLINICAL DATA:  Dizziness for 3 weeks. EXAM: CT HEAD WITHOUT CONTRAST TECHNIQUE: Contiguous axial images were obtained from the base of the skull through the vertex without intravenous contrast. COMPARISON:  None. FINDINGS: Brain: There is cortical atrophy and extensive chronic microvascular ischemic change. No evidence of acute intracranial abnormality including hemorrhage, infarct, mass lesion, mass effect, midline shift or abnormal extra-axial fluid collection. No hydrocephalus or pneumocephalus. Vascular: Extensive atherosclerotic vascular disease is identified. Skull: Intact. Sinuses/Orbits: Negative. Other: None. IMPRESSION: No acute abnormality. Atrophy and extensive chronic microvascular ischemic change. Atherosclerosis. Electronically Signed   By: Inge Rise M.D.   On: 07/07/2016 12:41    Scheduled  Meds: . aspirin EC  81 mg Oral Q1200  . atorvastatin  40 mg Oral QHS  . clopidogrel  75 mg Oral Daily  . feeding supplement (ENSURE ENLIVE)  237 mL Oral BID BM  . furosemide  40 mg Oral Daily  . hydrALAZINE  25 mg Oral TID  . insulin aspart  0-15 Units Subcutaneous TID WC  . insulin aspart  0-5 Units Subcutaneous QHS  . insulin glargine  10 Units Subcutaneous QHS  . isosorbide dinitrate  10 mg Oral TID  . labetalol  200 mg Oral BID  . sodium chloride flush  3 mL Intravenous Q12H   Continuous Infusions: . ciprofloxacin Stopped (07/08/16 0535)     LOS: 0 days   Chipper Oman, MD Pager: Text Page via [email or web address]  [phone]  If 7PM-7AM, please contact night-coverage [email or web address] Password [email or web address] 07/08/2016, 12:29 PM

## 2016-07-08 NOTE — Progress Notes (Signed)
Inpatient Diabetes Program Recommendations  AACE/ADA: New Consensus Statement on Inpatient Glycemic Control (2015)  Target Ranges:  Prepandial:   less than 140 mg/dL      Peak postprandial:   less than 180 mg/dL (1-2 hours)      Critically ill patients:  140 - 180 mg/dL   Lab Results  Component Value Date   GLUCAP 309 (H) 07/08/2016    Review of Glycemic Control:  Results for JACOBS, GOLAB (MRN [id number]) as of 07/08/2016 14:54  Ref. Range 07/07/2016 11:33 07/07/2016 23:22 07/08/2016 07:44 07/08/2016 12:09  Glucose-Capillary Latest Ref Range: 65 - 99 mg/dL 291 (H) 228 (H) 198 (H) 309 (H)   Diabetes history: Type 2 diabetes Outpatient Diabetes medications: Lantus 30 units daily, Novolog 10 units tid with meals Current orders for Inpatient glycemic control:  Novolog moderate tid with meals and HS, Lantus 10 units q HS  Inpatient Diabetes Program Recommendations:   Please consider increasing Lantus to 20 units q HS.  Also please add Novolog meal coverage 6 units tid with meals (hold if patient eats less than 50%).   Thanks, Adah Perl, RN, BC-ADM Inpatient Diabetes Coordinator Pager [phone] (8a-5p)

## 2016-07-08 NOTE — Care Management Note (Signed)
Case Management Note  Patient Details  Name: Wayne Curtis MRN: [id number] Date of Birth: 1941-01-20  Subjective/Objective: 76 y/o m admitted w/UTI. From home. PT recc HHPT/HHOT-Trying to find out which [facility] agency accepts insurance-[employer]-too high co pays, [facility], will check [facility]-rep [facility]-able to accept. No further CM needs.                   Action/Plan:d/c home w/HHC.   Expected Discharge Date:   (unknown)               Expected Discharge Plan:  [place]  In-House Referral:     Discharge planning Services  CM Consult  Post Acute Care Choice:    Choice offered to:     DME Arranged:    DME Agency:     HH Arranged:    HH Agency:     Status of Service:  In process, will continue to follow  If discussed at Long Length of Stay Meetings, dates discussed:    Additional Comments:  Dessa Phi, RN 07/08/2016, 3:03 PM

## 2016-07-09 DIAGNOSIS — R001 Bradycardia, unspecified: Secondary | ICD-10-CM | POA: Diagnosis not present

## 2016-07-09 DIAGNOSIS — R739 Hyperglycemia, unspecified: Secondary | ICD-10-CM

## 2016-07-09 DIAGNOSIS — Z794 Long term (current) use of insulin: Secondary | ICD-10-CM | POA: Diagnosis not present

## 2016-07-09 DIAGNOSIS — I5022 Chronic systolic (congestive) heart failure: Secondary | ICD-10-CM

## 2016-07-09 DIAGNOSIS — N183 Chronic kidney disease, stage 3 (moderate): Secondary | ICD-10-CM | POA: Diagnosis not present

## 2016-07-09 DIAGNOSIS — N390 Urinary tract infection, site not specified: Secondary | ICD-10-CM | POA: Diagnosis not present

## 2016-07-09 DIAGNOSIS — E440 Moderate protein-calorie malnutrition: Secondary | ICD-10-CM | POA: Insufficient documentation

## 2016-07-09 DIAGNOSIS — E119 Type 2 diabetes mellitus without complications: Secondary | ICD-10-CM | POA: Diagnosis not present

## 2016-07-09 DIAGNOSIS — I10 Essential (primary) hypertension: Secondary | ICD-10-CM | POA: Diagnosis not present

## 2016-07-09 LAB — CBC WITH DIFFERENTIAL/PLATELET
BASOS ABS: 0 10*3/uL (ref 0.0–0.1)
Basophils Relative: 1 %
EOS PCT: 12 %
Eosinophils Absolute: 0.5 10*3/uL (ref 0.0–0.7)
HEMATOCRIT: 34.4 % — AB (ref 39.0–52.0)
HEMOGLOBIN: 11.2 g/dL — AB (ref 13.0–17.0)
LYMPHS PCT: 30 %
Lymphs Abs: 1.2 10*3/uL (ref 0.7–4.0)
MCH: 26.2 pg (ref 26.0–34.0)
MCHC: 32.6 g/dL (ref 30.0–36.0)
MCV: 80.4 fL (ref 78.0–100.0)
MONO ABS: 0.2 10*3/uL (ref 0.1–1.0)
MONOS PCT: 6 %
Neutro Abs: 2 10*3/uL (ref 1.7–7.7)
Neutrophils Relative %: 51 %
PLATELETS: 217 10*3/uL (ref 150–400)
RBC: 4.28 MIL/uL (ref 4.22–5.81)
RDW: 16.4 % — ABNORMAL HIGH (ref 11.5–15.5)
WBC: 3.9 10*3/uL — ABNORMAL LOW (ref 4.0–10.5)

## 2016-07-09 LAB — GLUCOSE, CAPILLARY
GLUCOSE-CAPILLARY: 167 mg/dL — AB (ref 65–99)
Glucose-Capillary: 267 mg/dL — ABNORMAL HIGH (ref 65–99)

## 2016-07-09 LAB — BASIC METABOLIC PANEL
ANION GAP: 7 (ref 5–15)
BUN: 20 mg/dL (ref 6–20)
CHLORIDE: 104 mmol/L (ref 101–111)
CO2: 24 mmol/L (ref 22–32)
Calcium: 8.9 mg/dL (ref 8.9–10.3)
Creatinine, Ser: 1.49 mg/dL — ABNORMAL HIGH (ref 0.61–1.24)
GFR calc Af Amer: 51 mL/min — ABNORMAL LOW (ref >60)
GFR, EST NON AFRICAN AMERICAN: 44 mL/min — AB (ref >60)
Glucose, Bld: 308 mg/dL — ABNORMAL HIGH (ref 65–99)
POTASSIUM: 4.2 mmol/L (ref 3.5–5.1)
Sodium: 135 mmol/L (ref 135–145)

## 2016-07-09 LAB — URINE CULTURE: Culture: >=100000 — AB

## 2016-07-09 MED ORDER — PREMIER PROTEIN SHAKE: 11.0000 [oz_av] | Freq: Two times a day (BID) | ORAL | 0 refills | Status: AC

## 2016-07-09 MED ORDER — LABETALOL HCL 200 MG PO TABS: 200.0000 mg | ORAL_TABLET | Freq: Two times a day (BID) | ORAL | 0 refills | Status: DC

## 2016-07-09 MED ORDER — CEFUROXIME AXETIL 500 MG PO TABS
500.0000 mg | ORAL_TABLET | Freq: Two times a day (BID) | ORAL | Status: DC
  Administered 2016-07-09: 500 mg via ORAL
  Filled 2016-07-09 (×2): qty 1

## 2016-07-09 MED ORDER — CEFUROXIME AXETIL 500 MG PO TABS: 500.0000 mg | ORAL_TABLET | Freq: Two times a day (BID) | ORAL | 0 refills | Status: AC

## 2016-07-09 NOTE — Discharge Summary (Addendum)
Physician Discharge Summary  Wayne Curtis  DTO:[id number]  DOB: 07-14-1940  DOA: 07/07/2016 PCP: Wayne Stains, MD  Admit date: 07/07/2016 Discharge date: 07/09/2016  Admitted From: Home   Disposition:  Home   Recommendations for Outpatient Follow-up:  1. Follow up with PCP in 1-2 weeks 2. 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 Patient was found to have UA grossly contaminated consistent with UTI. Patient was admitted for IV antibiotics and further management.   Subjective: Patient seen and examined, have no complaints today. Ambulating well with walker. Denies chest pain, SOB, weakness and dizziness. Patient tolerating diet well, and remains afebrile   Discharge Diagnoses/Hospital Course:  E Coli - UTI - initially with weakness and dizziness which has improved Urine cultures shows sensitivity to cephalosporin, - will discharge with 10 days of Ceftin  Patient was initially treated with cipro which sensitivities shows resistant  No blood cultures were drawn Follow up with PCP - he will establish care on 07/15/16 with Wayne Schwalbe, MD   Sinus bradycardia - resolved after decreasing dose of labetalol  Hypertension  Dose of labetalol was decrease  BP remained stable during hospital stay  Continue Labetalol 200 mg BID  Monitor BP periodically  Follow up with PCP   CKD stage 3  Cr stable  Encourage oral hydration  Avoid nephrotoxic agents  Monitor BMP in 1 week   Chronic  systolic CHF  Seems to be compensated  Patient on Lasix and BB - consider switching patient to Coreg as outpatient as will be beneficial for CHF.  DM type 2 insulin dependent  A1C 8 in march 2018 per patient  CBG's slight elevated as lower dose of insulin was given during hospital stay. Resume home dose Lantus 30 units daily   Monitor glucose finger stick goal < 140 fasting   Moderate Protein calorie malnutrition  Encourage protein shakes   All other chronic medical condition were stable during the hospitalization.  Patient was seen by physical therapy, recommending home health PT/OT  On the day of the discharge the patient's vitals were stable, and no other acute medical condition were reported by patient. Patient was felt safe to be discharge to home   Discharge Instructions  You were cared for by a hospitalist during your hospital stay. If you have any questions about your discharge medications or the care you received while you were in the hospital after you are discharged, you can call the unit and asked to speak with the hospitalist on call if the hospitalist that took care of you is not available. Once you are discharged, your primary care physician will handle any further medical issues. Please note that NO REFILLS for any discharge medications will be authorized once you are discharged, as it is imperative that you return to your primary care physician (or establish a relationship with a primary care physician if you do not have one) for your aftercare needs so that they can reassess your need for medications and monitor your lab values.  Discharge Instructions    Call MD for:  difficulty breathing, headache or visual disturbances    Complete by:  As directed    Call MD for:  extreme fatigue    Complete by:  As directed    Call MD for:  hives    Complete by:  As directed    Call MD for:  persistant dizziness or light-headedness    Complete by:  As directed    Call MD for:   persistant nausea and vomiting    Complete by:  As directed    Call MD for:  redness, tenderness, or signs of infection (pain, swelling, redness, odor or green/yellow discharge around incision site)    Complete by:  As directed    Call MD for:  severe uncontrolled pain    Complete by:  As directed    Call MD for:  temperature >100.4    Complete by:  As directed    Diet - low sodium heart healthy    Complete by:  As directed    Increase activity slowly    Complete by:  As directed      Allergies as of 07/09/2016      Reactions   Penicillins Rash   Has patient had a PCN reaction causing immediate rash, facial/tongue/throat swelling, SOB or lightheadedness with hypotension:No Has patient had a PCN reaction causing severe rash involving mucus membranes or skin necrosis: No Has patient had a PCN reaction that required hospitalization: No Has patient had a PCN reaction occurring within the last 10 years: No If all of the above answers are "NO", then may proceed with Cephalosporin use.      Medication List    TAKE these medications   aspirin EC 81 MG tablet Take 81 mg by mouth daily at 12 noon.   atorvastatin 40 MG tablet Commonly known as:  LIPITOR Take 40 mg by mouth at bedtime.   cefUROXime 500 MG tablet Commonly known as:  CEFTIN Take 1 tablet (500 mg total) by mouth 2 (two) times daily with a meal.   clopidogrel 75 MG tablet Commonly known as:  PLAVIX Take 75 mg by mouth daily.   furosemide 40 MG tablet Commonly known as:  LASIX Take 40 mg by mouth daily.   insulin glargine 100 UNIT/ML injection Commonly known as:  LANTUS Inject 30 Units into the skin at bedtime.   labetalol 200 MG tablet Commonly known as:  NORMODYNE Take 1 tablet (200 mg total) by mouth 2 (two) times daily. What changed:  when to take this   multivitamin with minerals Tabs tablet Take 1 tablet by mouth daily.   NOVOLOG FLEXPEN [place] Inject 10 Units into the skin 3 (three) times daily before  meals.   protein supplement shake Liqd Commonly known as:  PREMIER PROTEIN Take 325 mLs (11 oz total) by mouth 2 (two) times daily between meals. Start taking on:  07/10/2016      Follow-up Information    Wayne Stains, MD Follow up.   Specialty:  Family Medicine Contact information: [address] [place] [address] [facility] Follow up.   Why:  [facility],occupational therapy Contact information: [place] [address] [phone]          Allergies  Allergen Reactions  . Penicillins Rash    Has patient had a PCN reaction causing immediate rash, facial/tongue/throat swelling, SOB or lightheadedness with hypotension:No Has patient had a PCN reaction causing severe  rash involving mucus membranes or skin necrosis: No Has patient had a PCN reaction that required hospitalization: No Has patient had a PCN reaction occurring within the last 10 years: No If all of the above answers are "NO", then may proceed with Cephalosporin use.     Consultations:  None    Procedures/Studies: Dg Chest 2 View  Result Date: 07/07/2016 CLINICAL DATA:  Dizziness x3wks. Has been w/o insulin same time period. Pt states having problems w/balance. EXAM: CHEST  2 VIEW COMPARISON:  None. FINDINGS: Cardiac silhouette is normal in size. No mediastinal or hilar masses. No evidence of adenopathy. Clear lungs.  No pleural effusion or pneumothorax. Skeletal structures are demineralized but intact. A loop recorder lies in the left anterior chest wall. IMPRESSION: No acute cardiopulmonary disease. Electronically Signed   By: Lajean Manes M.D.   On: 07/07/2016 12:42   Ct Head Wo Contrast  Result Date: 07/07/2016 CLINICAL DATA:  Dizziness for 3 weeks. EXAM: CT HEAD WITHOUT CONTRAST TECHNIQUE: Contiguous axial images were obtained from the base of the skull through the vertex without intravenous contrast. COMPARISON:   None. FINDINGS: Brain: There is cortical atrophy and extensive chronic microvascular ischemic change. No evidence of acute intracranial abnormality including hemorrhage, infarct, mass lesion, mass effect, midline shift or abnormal extra-axial fluid collection. No hydrocephalus or pneumocephalus. Vascular: Extensive atherosclerotic vascular disease is identified. Skull: Intact. Sinuses/Orbits: Negative. Other: None. IMPRESSION: No acute abnormality. Atrophy and extensive chronic microvascular ischemic change. Atherosclerosis. Electronically Signed   By: Inge Rise M.D.   On: 07/07/2016 12:41     Discharge Exam: Vitals:   07/09/16 0600 07/09/16 0810  BP: (!) 156/62 108/61  Pulse: 64 79  Resp: 18   Temp: 98.2 F (36.8 C)    Vitals:   07/08/16 1456 07/08/16 2138 07/09/16 0600 07/09/16 0810  BP: (!) 127/56 (!) 153/55 (!) 156/62 108/61  Pulse: 63 66 64 79  Resp: 18 18 18    Temp: 97.4 F (36.3 C) 98.8 F (37.1 C) 98.2 F (36.8 C)   TempSrc: Oral Oral Oral   SpO2: 100% 100% 96%   Weight:      Height:        General: Pt is alert, awake, not in acute distress Cardiovascular: RRR, S1/S2 +, no rubs, no gallops Respiratory: CTA bilaterally, no wheezing, no rhonchi Abdominal: Soft, NT, ND, bowel sounds + Extremities: no edema, no cyanosis Neuro: Gait stable with walker, slight wabble w/o assist   The results of significant diagnostics from this hospitalization (including imaging, microbiology, ancillary and laboratory) are listed below for reference.     Microbiology: Recent Results (from the past 240 hour(s))  Urine culture     Status: Abnormal   Collection Time: 07/07/16  2:25 PM  Result Value Ref Range Status   Specimen Description URINE, RANDOM  Final   Special Requests NONE  Final   Culture >=100,000 COLONIES/mL ESCHERICHIA COLI (A)  Final   Report Status 07/09/2016 FINAL  Final   Organism ID, Bacteria ESCHERICHIA COLI (A)  Final      Susceptibility   Escherichia coli -  MIC*    AMPICILLIN >=32 RESISTANT Resistant     CEFAZOLIN <=4 SENSITIVE Sensitive     CEFTRIAXONE <=1 SENSITIVE Sensitive     CIPROFLOXACIN >=4 RESISTANT Resistant     GENTAMICIN <=1 SENSITIVE Sensitive     IMIPENEM <=0.25 SENSITIVE Sensitive     NITROFURANTOIN <=16 SENSITIVE Sensitive     TRIMETH/SULFA <=20 SENSITIVE Sensitive  AMPICILLIN/SULBACTAM 16 INTERMEDIATE Intermediate     PIP/TAZO <=4 SENSITIVE Sensitive     Extended ESBL NEGATIVE Sensitive     * >=100,000 COLONIES/mL ESCHERICHIA COLI     Labs: BNP (last 3 results) No results for input(s): BNP in the last 8760 hours. Basic Metabolic Panel:  Recent Labs Lab 07/07/16 1241 07/08/16 0618 07/09/16 0909  NA 134* 137 135  K 3.9 4.2 4.2  CL 100* 106 104  CO2 26 25 24   GLUCOSE 175* 221* 308*  BUN 28* 20 20  CREATININE 1.39* 1.26* 1.49*  CALCIUM 9.6 8.9 8.9   Liver Function Tests: No results for input(s): AST, ALT, ALKPHOS, BILITOT, PROT, ALBUMIN in the last 168 hours. No results for input(s): LIPASE, AMYLASE in the last 168 hours. No results for input(s): AMMONIA in the last 168 hours. CBC:  Recent Labs Lab 07/07/16 1241 07/08/16 0618 07/09/16 0909  WBC 4.9 5.5 3.9*  NEUTROABS 2.9  --  2.0  HGB 12.4* 10.9* 11.2*  HCT 37.6* 33.0* 34.4*  MCV 80.7 80.5 80.4  PLT 229 207 217   Cardiac Enzymes:  Recent Labs Lab 07/07/16 1241  TROPONINI <0.03   BNP: Invalid input(s): POCBNP CBG:  Recent Labs Lab 07/08/16 1209 07/08/16 1713 07/08/16 2136 07/09/16 0745 07/09/16 1210  GLUCAP 309* 215* 148* 167* 267*   D-Dimer No results for input(s): DDIMER in the last 72 hours. Hgb A1c No results for input(s): HGBA1C in the last 72 hours. Lipid Profile No results for input(s): CHOL, HDL, LDLCALC, TRIG, CHOLHDL, LDLDIRECT in the last 72 hours. Thyroid function studies No results for input(s): TSH, T4TOTAL, T3FREE, THYROIDAB in the last 72 hours.  Invalid input(s): FREET3 Anemia work up No results for  input(s): VITAMINB12, FOLATE, FERRITIN, TIBC, IRON, RETICCTPCT in the last 72 hours. Urinalysis    Component Value Date/Time   COLORURINE YELLOW 07/07/2016 1425   APPEARANCEUR CLOUDY (A) 07/07/2016 1425   LABSPEC 1.011 07/07/2016 1425   PHURINE 5.0 07/07/2016 1425   GLUCOSEU 250 (A) 07/07/2016 1425   HGBUR TRACE (A) 07/07/2016 1425   BILIRUBINUR NEGATIVE 07/07/2016 1425   KETONESUR NEGATIVE 07/07/2016 1425   PROTEINUR NEGATIVE 07/07/2016 1425   NITRITE POSITIVE (A) 07/07/2016 1425   LEUKOCYTESUR LARGE (A) 07/07/2016 1425   Sepsis Labs Invalid input(s): PROCALCITONIN,  WBC,  LACTICIDVEN Microbiology Recent Results (from the past 240 hour(s))  Urine culture     Status: Abnormal   Collection Time: 07/07/16  2:25 PM  Result Value Ref Range Status   Specimen Description URINE, RANDOM  Final   Special Requests NONE  Final   Culture >=100,000 COLONIES/mL ESCHERICHIA COLI (A)  Final   Report Status 07/09/2016 FINAL  Final   Organism ID, Bacteria ESCHERICHIA COLI (A)  Final      Susceptibility   Escherichia coli - MIC*    AMPICILLIN >=32 RESISTANT Resistant     CEFAZOLIN <=4 SENSITIVE Sensitive     CEFTRIAXONE <=1 SENSITIVE Sensitive     CIPROFLOXACIN >=4 RESISTANT Resistant     GENTAMICIN <=1 SENSITIVE Sensitive     IMIPENEM <=0.25 SENSITIVE Sensitive     NITROFURANTOIN <=16 SENSITIVE Sensitive     TRIMETH/SULFA <=20 SENSITIVE Sensitive     AMPICILLIN/SULBACTAM 16 INTERMEDIATE Intermediate     PIP/TAZO <=4 SENSITIVE Sensitive     Extended ESBL NEGATIVE Sensitive     * >=100,000 COLONIES/mL ESCHERICHIA COLI     Time coordinating discharge: 32 minutes  SIGNED:  Chipper Oman, MD  [facility] 07/09/2016, 1:40 PM  Pager please text page via  [email or web address] Password [email or web address]

## 2016-07-09 NOTE — Evaluation (Signed)
Occupational Therapy Evaluation Patient Details Name: Wayne Curtis MRN: [id number] DOB: 18-May-1940 Today's Date: 07/09/2016    History of Present Illness 76 yo male admitted with UTi, hyperglycemia, dizzness. Hx of CVA, CHA, prostate ca, PAD, CKD, shingles   Clinical Impression   Pt admitted with UTI. Pt currently with functional limitations due to the deficits listed below (see OT Problem List).  Pt will benefit from skilled OT to increase their safety and independence with ADL and functional mobility for ADL to facilitate discharge to venue listed below.      Follow Up Recommendations  Home health OT;Supervision/Assistance - 24 hour    Equipment Recommendations  None recommended by OT       Precautions / Restrictions Precautions Precautions: Fall Restrictions Weight Bearing Restrictions: No      Mobility Bed Mobility Overal bed mobility: Needs Assistance Bed Mobility: Sit to Supine       Sit to supine: Supervision   General bed mobility comments: close guard for safety  Transfers Overall transfer level: Needs assistance Equipment used: 1 person hand held assist Transfers: Sit to/from Stand;Stand Pivot Transfers Sit to Stand: Min assist Stand pivot transfers: Min assist;Mod assist            Balance Overall balance assessment: Needs assistance           Standing balance-Leahy Scale: Fair                             ADL either performed or assessed with clinical judgement   ADL Overall ADL's : Needs assistance/impaired     Grooming: Wash/dry hands;Standing           Upper Body Dressing : Set up;Sitting   Lower Body Dressing: Minimal assistance   Toilet Transfer: Minimal assistance;Ambulation;Moderate assistance   Toileting- Clothing Manipulation and Hygiene: Minimal assistance;Sit to/from stand         General ADL Comments: Upon OT arriving pt was standing waiting to go to bathroom.  Pt had urinated in a cup and wanted to go  in and pour it out. Pt very unsteady with OT. Walked to bathroom and back to bed                  Pertinent Vitals/Pain Pain Assessment: No/denies pain        Extremity/Trunk Assessment         Cervical / Trunk Assessment Cervical / Trunk Assessment: Normal   Communication Communication Communication: No difficulties   Cognition Arousal/Alertness: Awake/alert Behavior During Therapy: WFL for tasks assessed/performed Overall Cognitive Status: Within Functional Limits for tasks assessed                                                Home Living Family/patient expects to be discharged to:: Private residence Living Arrangements: Children Available Help at Discharge: Family Type of Home: Apartment Home Access: Stairs to enter Technical brewer of Steps: 1 flight   Home Layout: One level     Bathroom Shower/Tub: Tub/shower unit         Home Equipment: Environmental consultant - 2 wheels          Prior Functioning/Environment Level of Independence: Independent with assistive device(s)                 OT Problem List: Decreased strength;Decreased  activity tolerance;Decreased knowledge of use of DME or AE;Decreased safety awareness      OT Treatment/Interventions: Self-care/ADL training;Patient/family education;DME and/or AE instruction    OT Goals(Current goals can be found in the care plan section) Acute Rehab OT Goals Patient Stated Goal: none stated Time For Goal Achievement: 07/23/16  OT Frequency: Min 2X/week   Barriers to D/C:               AM-PAC PT "6 Clicks" Daily Activity     Outcome Measure Help from another person eating meals?: None Help from another person taking care of personal grooming?: A Little Help from another person toileting, which includes using toliet, bedpan, or urinal?: A Little Help from another person bathing (including washing, rinsing, drying)?: A Little Help from another person to put on and taking off  regular upper body clothing?: A Little Help from another person to put on and taking off regular lower body clothing?: A Little 6 Click Score: 19   End of Session Nurse Communication: Mobility status  Activity Tolerance: Patient tolerated treatment well Patient left: in bed;with call bell/phone within reach;with bed alarm set  OT Visit Diagnosis: Unsteadiness on feet (R26.81);Muscle weakness (generalized) (M62.81)                T  Charges:  OT General Charges $OT Visit: 1 Procedure OT Evaluation $OT Eval Moderate Complexity: 1 Procedure G-Codes:     Kari Baars, OT [phone]  Payton Mccallum D 07/09/2016, 11:03 AM

## 2016-07-09 NOTE — Progress Notes (Signed)
Notified Jermaine at [facility] that pt is going home today.

## 2016-07-12 ENCOUNTER — Encounter (HOSPITAL_BASED_OUTPATIENT_CLINIC_OR_DEPARTMENT_OTHER): Payer: Self-pay | Admitting: *Deleted

## 2016-07-12 ENCOUNTER — Emergency Department (HOSPITAL_BASED_OUTPATIENT_CLINIC_OR_DEPARTMENT_OTHER)
Admission: EM | Admit: 2016-07-12 | Discharge: 2016-07-13 | Disposition: A | Discharge status: Home / Self Care | Payer: Medicare HMO | Attending: Emergency Medicine | Admitting: Emergency Medicine

## 2016-07-12 DIAGNOSIS — E119 Type 2 diabetes mellitus without complications: Secondary | ICD-10-CM | POA: Diagnosis not present

## 2016-07-12 DIAGNOSIS — I129 Hypertensive chronic kidney disease with stage 1 through stage 4 chronic kidney disease, or unspecified chronic kidney disease: Secondary | ICD-10-CM | POA: Insufficient documentation

## 2016-07-12 DIAGNOSIS — K5641 Fecal impaction: Secondary | ICD-10-CM | POA: Insufficient documentation

## 2016-07-12 DIAGNOSIS — I5040 Unspecified combined systolic (congestive) and diastolic (congestive) heart failure: Secondary | ICD-10-CM | POA: Insufficient documentation

## 2016-07-12 DIAGNOSIS — Z8546 Personal history of malignant neoplasm of prostate: Secondary | ICD-10-CM | POA: Insufficient documentation

## 2016-07-12 DIAGNOSIS — I059 Rheumatic mitral valve disease, unspecified: Secondary | ICD-10-CM | POA: Diagnosis not present

## 2016-07-12 DIAGNOSIS — Z7902 Long term (current) use of antithrombotics/antiplatelets: Secondary | ICD-10-CM | POA: Diagnosis not present

## 2016-07-12 DIAGNOSIS — N189 Chronic kidney disease, unspecified: Secondary | ICD-10-CM | POA: Insufficient documentation

## 2016-07-12 DIAGNOSIS — Z7982 Long term (current) use of aspirin: Secondary | ICD-10-CM | POA: Insufficient documentation

## 2016-07-12 DIAGNOSIS — I25119 Atherosclerotic heart disease of native coronary artery with unspecified angina pectoris: Secondary | ICD-10-CM | POA: Diagnosis not present

## 2016-07-12 DIAGNOSIS — Z794 Long term (current) use of insulin: Secondary | ICD-10-CM | POA: Diagnosis not present

## 2016-07-12 DIAGNOSIS — I5022 Chronic systolic (congestive) heart failure: Secondary | ICD-10-CM | POA: Diagnosis not present

## 2016-07-12 DIAGNOSIS — N390 Urinary tract infection, site not specified: Secondary | ICD-10-CM | POA: Diagnosis not present

## 2016-07-12 DIAGNOSIS — I2510 Atherosclerotic heart disease of native coronary artery without angina pectoris: Secondary | ICD-10-CM | POA: Diagnosis not present

## 2016-07-12 DIAGNOSIS — E1122 Type 2 diabetes mellitus with diabetic chronic kidney disease: Secondary | ICD-10-CM | POA: Diagnosis not present

## 2016-07-12 DIAGNOSIS — N183 Chronic kidney disease, stage 3 (moderate): Secondary | ICD-10-CM | POA: Diagnosis not present

## 2016-07-12 DIAGNOSIS — Z8673 Personal history of transient ischemic attack (TIA), and cerebral infarction without residual deficits: Secondary | ICD-10-CM | POA: Diagnosis not present

## 2016-07-12 DIAGNOSIS — R1084 Generalized abdominal pain: Secondary | ICD-10-CM | POA: Diagnosis present

## 2016-07-12 DIAGNOSIS — B9620 Unspecified Escherichia coli [E. coli] as the cause of diseases classified elsewhere: Secondary | ICD-10-CM | POA: Diagnosis not present

## 2016-07-12 DIAGNOSIS — I739 Peripheral vascular disease, unspecified: Secondary | ICD-10-CM | POA: Diagnosis not present

## 2016-07-12 NOTE — ED Triage Notes (Signed)
Constipation. No BM for 3 days. Abdominal pain. He has been drinking hot tea, prune juice and water. He was given an enema by his daughter tonight with no returns. He took MOM 4:30 this afternoon.

## 2016-07-12 NOTE — ED Provider Notes (Signed)
[facility] DEPT [facility] Provider Note: Georgena Spurling, MD, FACEP  CSN: [id number] MRN: [id number] ARRIVAL: 07/12/16 at 2235 ROOM: [place]  Abdominal Pain   HISTORY OF PRESENT ILLNESS  Wayne Curtis is a 76 y.o. male who was recently hospitalized for urinary tract infection. He is here with no bowel movement for 3 days. There is been associated central mild cramping abdominal pain. He is not having rectal pain except when attempting to move his bowels. He has been taking tea and prune juice without relief. He took milk of magnesia this afternoon about 4 PM. His daughter gave him an enema about 9 PM. He still complains of inability to defecate. He has not been vomiting and has had very little nausea.   Past Medical History:  Diagnosis Date  . Blurred vision, left eye    Chronic  . CHF (congestive heart failure) ([place])   . CKD (chronic kidney disease)   . Coronary artery disease   . Diabetes mellitus without complication ([place])   . History of shingles   . Hyperlipidemia   . Hypertension   . Peripheral artery disease ([place])   . Prostate cancer ([place])   . Pulmonary edema   . Rheumatic mitral valve disease   . Stroke [facility])     Past Surgical History:  Procedure Laterality Date  . CATARACT EXTRACTION, BILATERAL      Family History  Problem Relation Age of Onset  . Pancreatic cancer Sister   . Stomach cancer Brother   . Benign prostatic hyperplasia Brother   . Throat cancer Brother     Social History  Substance Use Topics  . Smoking status: Never Smoker  . Smokeless tobacco: Never Used  . Alcohol use No    Prior to Admission medications   Medication Sig Start Date End Date Taking? Authorizing Provider  aspirin EC 81 MG tablet Take 81 mg by mouth daily at 12 noon.     [provider]  atorvastatin (LIPITOR) 40 MG tablet Take 40 mg by mouth at bedtime.     [provider]  cefUROXime (CEFTIN) 500 MG tablet Take 1 tablet (500 mg total)  by mouth 2 (two) times daily with a meal. 07/09/16 07/17/16  Patrecia Pour, Christean Grief, MD  clopidogrel (PLAVIX) 75 MG tablet Take 75 mg by mouth daily.    [provider]  furosemide (LASIX) 40 MG tablet Take 40 mg by mouth daily.    [provider]  Insulin Aspart (NOVOLOG FLEXPEN [place]) Inject 10 Units into the skin 3 (three) times daily before meals.     [provider]  insulin glargine (LANTUS) 100 UNIT/ML injection Inject 30 Units into the skin at bedtime.     [provider]  labetalol (NORMODYNE) 200 MG tablet Take 1 tablet (200 mg total) by mouth 2 (two) times daily. 07/09/16   Doreatha Lew, MD  Multiple Vitamin (MULTIVITAMIN WITH MINERALS) TABS tablet Take 1 tablet by mouth daily.    [provider]  protein supplement shake (PREMIER PROTEIN) LIQD Take 325 mLs (11 oz total) by mouth 2 (two) times daily between meals. 07/10/16   Doreatha Lew, MD    Allergies Penicillins   REVIEW OF SYSTEMS  Negative except as noted here or in the History of Present Illness.   PHYSICAL EXAMINATION  Initial Vital Signs Blood pressure (!) 178/98, pulse 76, temperature 97.8 F (36.6 C), temperature source Oral, resp. rate 16, height 5' 11.5" (1.816 m), weight 202  lb (91.6 kg), SpO2 98 %.  Examination General: Well-developed, well-nourished male in no acute distress; appearance consistent with age of record HENT: normocephalic; atraumatic Eyes: pupils equal, round and reactive to light; extraocular muscles intact; lens implants Neck: supple Heart: regular rate and rhythm Lungs: clear to auscultation bilaterally Abdomen: soft; mildly distended; mild central tenderness; no masses or hepatosplenomegaly; bowel sounds hypoactive Rectal: High fecal impaction Extremities: No deformity; full range of motion; pulses normal Neurologic: Awake, alert; motor function intact in all extremities and symmetric; no facial droop Skin: Warm and dry Psychiatric: Normal  mood and affect   RESULTS  Summary of this visit's results, reviewed by myself:   EKG Interpretation  Date/Time:    Ventricular Rate:    PR Interval:    QRS Duration:   QT Interval:    QTC Calculation:   R Axis:     Text Interpretation:        Laboratory Studies: No results found for this or any previous visit (from the past 24 hour(s)). Imaging Studies: No results found.  ED COURSE  Nursing notes and initial vitals signs, including pulse oximetry, reviewed.  Vitals:   07/12/16 2239 07/12/16 2241  BP: (!) 178/98   Pulse: 76   Resp: 16   Temp: 97.8 F (36.6 C)   TempSrc: Oral   SpO2: 98%   Weight: 202 lb (91.6 kg) 202 lb (91.6 kg)  Height: 5' 11.5" (1.816 m) 5' 11.5" (1.816 m)   12:37 AM Patient feels "100% better" after soapsuds enema followed by manual disimpaction by his nurse. Rectal vault is now empty. Abdomen is soft and nontender.  PROCEDURES    ED DIAGNOSES     ICD-9-CM ICD-10-CM   1. Fecal impaction in rectum [facility]) 560.32 K56.41        Cynde Menard, Jenny Reichmann, MD 07/13/16 [phone]

## 2016-07-13 NOTE — ED Notes (Signed)
Patient is sitting on the bedside commode bearing down.  Facial grimace observed.  Checked for fecal impaction, positive.  Soap sud enema partially given.  Manually disimpact patient and was able to evacuate hard stools and remaining soap sud enema administered.  Well formed stools continue to come out and patient stated that he felt a lot better.

## 2016-07-13 NOTE — ED Notes (Signed)
ED Provider at bedside. 

## 2016-07-13 NOTE — ED Notes (Signed)
MD checked for fecal impaction, negative at this time.

## 2016-07-14 DIAGNOSIS — N390 Urinary tract infection, site not specified: Secondary | ICD-10-CM | POA: Diagnosis not present

## 2016-07-14 DIAGNOSIS — I059 Rheumatic mitral valve disease, unspecified: Secondary | ICD-10-CM | POA: Diagnosis not present

## 2016-07-14 DIAGNOSIS — E1122 Type 2 diabetes mellitus with diabetic chronic kidney disease: Secondary | ICD-10-CM | POA: Diagnosis not present

## 2016-07-14 DIAGNOSIS — Z8673 Personal history of transient ischemic attack (TIA), and cerebral infarction without residual deficits: Secondary | ICD-10-CM | POA: Diagnosis not present

## 2016-07-14 DIAGNOSIS — B9620 Unspecified Escherichia coli [E. coli] as the cause of diseases classified elsewhere: Secondary | ICD-10-CM | POA: Diagnosis not present

## 2016-07-14 DIAGNOSIS — I2510 Atherosclerotic heart disease of native coronary artery without angina pectoris: Secondary | ICD-10-CM | POA: Diagnosis not present

## 2016-07-14 DIAGNOSIS — I5022 Chronic systolic (congestive) heart failure: Secondary | ICD-10-CM | POA: Diagnosis not present

## 2016-07-14 DIAGNOSIS — I129 Hypertensive chronic kidney disease with stage 1 through stage 4 chronic kidney disease, or unspecified chronic kidney disease: Secondary | ICD-10-CM | POA: Diagnosis not present

## 2016-07-14 DIAGNOSIS — I739 Peripheral vascular disease, unspecified: Secondary | ICD-10-CM | POA: Diagnosis not present

## 2016-07-14 DIAGNOSIS — N183 Chronic kidney disease, stage 3 (moderate): Secondary | ICD-10-CM | POA: Diagnosis not present

## 2016-07-15 DIAGNOSIS — E785 Hyperlipidemia, unspecified: Secondary | ICD-10-CM | POA: Diagnosis not present

## 2016-07-15 DIAGNOSIS — I10 Essential (primary) hypertension: Secondary | ICD-10-CM | POA: Diagnosis not present

## 2016-07-15 DIAGNOSIS — Z794 Long term (current) use of insulin: Secondary | ICD-10-CM | POA: Diagnosis not present

## 2016-07-15 DIAGNOSIS — E1151 Type 2 diabetes mellitus with diabetic peripheral angiopathy without gangrene: Secondary | ICD-10-CM | POA: Diagnosis not present

## 2016-07-15 DIAGNOSIS — Z23 Encounter for immunization: Secondary | ICD-10-CM | POA: Diagnosis not present

## 2016-07-15 DIAGNOSIS — N179 Acute kidney failure, unspecified: Secondary | ICD-10-CM | POA: Diagnosis not present

## 2016-07-15 DIAGNOSIS — I5022 Chronic systolic (congestive) heart failure: Secondary | ICD-10-CM | POA: Diagnosis not present

## 2016-07-15 DIAGNOSIS — E1165 Type 2 diabetes mellitus with hyperglycemia: Secondary | ICD-10-CM | POA: Diagnosis not present

## 2016-07-15 DIAGNOSIS — R32 Unspecified urinary incontinence: Secondary | ICD-10-CM | POA: Diagnosis not present

## 2016-07-15 DIAGNOSIS — N390 Urinary tract infection, site not specified: Secondary | ICD-10-CM | POA: Diagnosis not present

## 2016-07-19 DIAGNOSIS — I059 Rheumatic mitral valve disease, unspecified: Secondary | ICD-10-CM | POA: Diagnosis not present

## 2016-07-19 DIAGNOSIS — I129 Hypertensive chronic kidney disease with stage 1 through stage 4 chronic kidney disease, or unspecified chronic kidney disease: Secondary | ICD-10-CM | POA: Diagnosis not present

## 2016-07-19 DIAGNOSIS — B9620 Unspecified Escherichia coli [E. coli] as the cause of diseases classified elsewhere: Secondary | ICD-10-CM | POA: Diagnosis not present

## 2016-07-19 DIAGNOSIS — I5022 Chronic systolic (congestive) heart failure: Secondary | ICD-10-CM | POA: Diagnosis not present

## 2016-07-19 DIAGNOSIS — N183 Chronic kidney disease, stage 3 (moderate): Secondary | ICD-10-CM | POA: Diagnosis not present

## 2016-07-19 DIAGNOSIS — N390 Urinary tract infection, site not specified: Secondary | ICD-10-CM | POA: Diagnosis not present

## 2016-07-19 DIAGNOSIS — Z8673 Personal history of transient ischemic attack (TIA), and cerebral infarction without residual deficits: Secondary | ICD-10-CM | POA: Diagnosis not present

## 2016-07-19 DIAGNOSIS — I739 Peripheral vascular disease, unspecified: Secondary | ICD-10-CM | POA: Diagnosis not present

## 2016-07-19 DIAGNOSIS — E1122 Type 2 diabetes mellitus with diabetic chronic kidney disease: Secondary | ICD-10-CM | POA: Diagnosis not present

## 2016-07-19 DIAGNOSIS — I2510 Atherosclerotic heart disease of native coronary artery without angina pectoris: Secondary | ICD-10-CM | POA: Diagnosis not present

## 2016-07-21 DIAGNOSIS — I059 Rheumatic mitral valve disease, unspecified: Secondary | ICD-10-CM | POA: Diagnosis not present

## 2016-07-21 DIAGNOSIS — Z9181 History of falling: Secondary | ICD-10-CM | POA: Diagnosis not present

## 2016-07-21 DIAGNOSIS — N183 Chronic kidney disease, stage 3 (moderate): Secondary | ICD-10-CM | POA: Diagnosis not present

## 2016-07-21 DIAGNOSIS — E1122 Type 2 diabetes mellitus with diabetic chronic kidney disease: Secondary | ICD-10-CM | POA: Diagnosis not present

## 2016-07-21 DIAGNOSIS — Z8673 Personal history of transient ischemic attack (TIA), and cerebral infarction without residual deficits: Secondary | ICD-10-CM | POA: Diagnosis not present

## 2016-07-21 DIAGNOSIS — I2510 Atherosclerotic heart disease of native coronary artery without angina pectoris: Secondary | ICD-10-CM | POA: Diagnosis not present

## 2016-07-21 DIAGNOSIS — N390 Urinary tract infection, site not specified: Secondary | ICD-10-CM | POA: Diagnosis not present

## 2016-07-21 DIAGNOSIS — B9620 Unspecified Escherichia coli [E. coli] as the cause of diseases classified elsewhere: Secondary | ICD-10-CM | POA: Diagnosis not present

## 2016-07-21 DIAGNOSIS — I739 Peripheral vascular disease, unspecified: Secondary | ICD-10-CM | POA: Diagnosis not present

## 2016-07-21 DIAGNOSIS — I129 Hypertensive chronic kidney disease with stage 1 through stage 4 chronic kidney disease, or unspecified chronic kidney disease: Secondary | ICD-10-CM | POA: Diagnosis not present

## 2016-07-21 DIAGNOSIS — I5022 Chronic systolic (congestive) heart failure: Secondary | ICD-10-CM | POA: Diagnosis not present

## 2016-07-21 DIAGNOSIS — R42 Dizziness and giddiness: Secondary | ICD-10-CM | POA: Diagnosis not present

## 2016-07-26 DIAGNOSIS — B9620 Unspecified Escherichia coli [E. coli] as the cause of diseases classified elsewhere: Secondary | ICD-10-CM | POA: Diagnosis not present

## 2016-07-26 DIAGNOSIS — N183 Chronic kidney disease, stage 3 (moderate): Secondary | ICD-10-CM | POA: Diagnosis not present

## 2016-07-26 DIAGNOSIS — I2510 Atherosclerotic heart disease of native coronary artery without angina pectoris: Secondary | ICD-10-CM | POA: Diagnosis not present

## 2016-07-26 DIAGNOSIS — I739 Peripheral vascular disease, unspecified: Secondary | ICD-10-CM | POA: Diagnosis not present

## 2016-07-26 DIAGNOSIS — I129 Hypertensive chronic kidney disease with stage 1 through stage 4 chronic kidney disease, or unspecified chronic kidney disease: Secondary | ICD-10-CM | POA: Diagnosis not present

## 2016-07-26 DIAGNOSIS — I059 Rheumatic mitral valve disease, unspecified: Secondary | ICD-10-CM | POA: Diagnosis not present

## 2016-07-26 DIAGNOSIS — N390 Urinary tract infection, site not specified: Secondary | ICD-10-CM | POA: Diagnosis not present

## 2016-07-26 DIAGNOSIS — E1122 Type 2 diabetes mellitus with diabetic chronic kidney disease: Secondary | ICD-10-CM | POA: Diagnosis not present

## 2016-07-26 DIAGNOSIS — Z8673 Personal history of transient ischemic attack (TIA), and cerebral infarction without residual deficits: Secondary | ICD-10-CM | POA: Diagnosis not present

## 2016-07-26 DIAGNOSIS — I5022 Chronic systolic (congestive) heart failure: Secondary | ICD-10-CM | POA: Diagnosis not present

## 2016-07-27 DIAGNOSIS — N183 Chronic kidney disease, stage 3 (moderate): Secondary | ICD-10-CM | POA: Diagnosis not present

## 2016-07-27 DIAGNOSIS — I5022 Chronic systolic (congestive) heart failure: Secondary | ICD-10-CM | POA: Diagnosis not present

## 2016-07-27 DIAGNOSIS — I739 Peripheral vascular disease, unspecified: Secondary | ICD-10-CM | POA: Diagnosis not present

## 2016-07-27 DIAGNOSIS — I129 Hypertensive chronic kidney disease with stage 1 through stage 4 chronic kidney disease, or unspecified chronic kidney disease: Secondary | ICD-10-CM | POA: Diagnosis not present

## 2016-07-27 DIAGNOSIS — N390 Urinary tract infection, site not specified: Secondary | ICD-10-CM | POA: Diagnosis not present

## 2016-07-27 DIAGNOSIS — B9620 Unspecified Escherichia coli [E. coli] as the cause of diseases classified elsewhere: Secondary | ICD-10-CM | POA: Diagnosis not present

## 2016-07-27 DIAGNOSIS — I059 Rheumatic mitral valve disease, unspecified: Secondary | ICD-10-CM | POA: Diagnosis not present

## 2016-07-27 DIAGNOSIS — E1122 Type 2 diabetes mellitus with diabetic chronic kidney disease: Secondary | ICD-10-CM | POA: Diagnosis not present

## 2016-07-27 DIAGNOSIS — Z8673 Personal history of transient ischemic attack (TIA), and cerebral infarction without residual deficits: Secondary | ICD-10-CM | POA: Diagnosis not present

## 2016-07-27 DIAGNOSIS — I2510 Atherosclerotic heart disease of native coronary artery without angina pectoris: Secondary | ICD-10-CM | POA: Diagnosis not present

## 2016-07-29 DIAGNOSIS — N390 Urinary tract infection, site not specified: Secondary | ICD-10-CM | POA: Diagnosis not present

## 2016-07-29 DIAGNOSIS — N183 Chronic kidney disease, stage 3 (moderate): Secondary | ICD-10-CM | POA: Diagnosis not present

## 2016-07-29 DIAGNOSIS — I129 Hypertensive chronic kidney disease with stage 1 through stage 4 chronic kidney disease, or unspecified chronic kidney disease: Secondary | ICD-10-CM | POA: Diagnosis not present

## 2016-07-29 DIAGNOSIS — I739 Peripheral vascular disease, unspecified: Secondary | ICD-10-CM | POA: Diagnosis not present

## 2016-07-29 DIAGNOSIS — I5022 Chronic systolic (congestive) heart failure: Secondary | ICD-10-CM | POA: Diagnosis not present

## 2016-07-29 DIAGNOSIS — I059 Rheumatic mitral valve disease, unspecified: Secondary | ICD-10-CM | POA: Diagnosis not present

## 2016-07-29 DIAGNOSIS — E1122 Type 2 diabetes mellitus with diabetic chronic kidney disease: Secondary | ICD-10-CM | POA: Diagnosis not present

## 2016-07-29 DIAGNOSIS — B9620 Unspecified Escherichia coli [E. coli] as the cause of diseases classified elsewhere: Secondary | ICD-10-CM | POA: Diagnosis not present

## 2016-07-29 DIAGNOSIS — Z8673 Personal history of transient ischemic attack (TIA), and cerebral infarction without residual deficits: Secondary | ICD-10-CM | POA: Diagnosis not present

## 2016-07-29 DIAGNOSIS — I2510 Atherosclerotic heart disease of native coronary artery without angina pectoris: Secondary | ICD-10-CM | POA: Diagnosis not present

## 2016-08-01 DIAGNOSIS — I059 Rheumatic mitral valve disease, unspecified: Secondary | ICD-10-CM | POA: Diagnosis not present

## 2016-08-01 DIAGNOSIS — N183 Chronic kidney disease, stage 3 (moderate): Secondary | ICD-10-CM | POA: Diagnosis not present

## 2016-08-01 DIAGNOSIS — N390 Urinary tract infection, site not specified: Secondary | ICD-10-CM | POA: Diagnosis not present

## 2016-08-01 DIAGNOSIS — I5022 Chronic systolic (congestive) heart failure: Secondary | ICD-10-CM | POA: Diagnosis not present

## 2016-08-01 DIAGNOSIS — I739 Peripheral vascular disease, unspecified: Secondary | ICD-10-CM | POA: Diagnosis not present

## 2016-08-01 DIAGNOSIS — E1122 Type 2 diabetes mellitus with diabetic chronic kidney disease: Secondary | ICD-10-CM | POA: Diagnosis not present

## 2016-08-01 DIAGNOSIS — Z8673 Personal history of transient ischemic attack (TIA), and cerebral infarction without residual deficits: Secondary | ICD-10-CM | POA: Diagnosis not present

## 2016-08-01 DIAGNOSIS — I129 Hypertensive chronic kidney disease with stage 1 through stage 4 chronic kidney disease, or unspecified chronic kidney disease: Secondary | ICD-10-CM | POA: Diagnosis not present

## 2016-08-01 DIAGNOSIS — I2510 Atherosclerotic heart disease of native coronary artery without angina pectoris: Secondary | ICD-10-CM | POA: Diagnosis not present

## 2016-08-01 DIAGNOSIS — B9620 Unspecified Escherichia coli [E. coli] as the cause of diseases classified elsewhere: Secondary | ICD-10-CM | POA: Diagnosis not present

## 2016-08-04 DIAGNOSIS — N183 Chronic kidney disease, stage 3 (moderate): Secondary | ICD-10-CM | POA: Diagnosis not present

## 2016-08-04 DIAGNOSIS — I059 Rheumatic mitral valve disease, unspecified: Secondary | ICD-10-CM | POA: Diagnosis not present

## 2016-08-04 DIAGNOSIS — E1122 Type 2 diabetes mellitus with diabetic chronic kidney disease: Secondary | ICD-10-CM | POA: Diagnosis not present

## 2016-08-04 DIAGNOSIS — I2510 Atherosclerotic heart disease of native coronary artery without angina pectoris: Secondary | ICD-10-CM | POA: Diagnosis not present

## 2016-08-04 DIAGNOSIS — Z8673 Personal history of transient ischemic attack (TIA), and cerebral infarction without residual deficits: Secondary | ICD-10-CM | POA: Diagnosis not present

## 2016-08-04 DIAGNOSIS — I5022 Chronic systolic (congestive) heart failure: Secondary | ICD-10-CM | POA: Diagnosis not present

## 2016-08-04 DIAGNOSIS — B9620 Unspecified Escherichia coli [E. coli] as the cause of diseases classified elsewhere: Secondary | ICD-10-CM | POA: Diagnosis not present

## 2016-08-04 DIAGNOSIS — I739 Peripheral vascular disease, unspecified: Secondary | ICD-10-CM | POA: Diagnosis not present

## 2016-08-04 DIAGNOSIS — N390 Urinary tract infection, site not specified: Secondary | ICD-10-CM | POA: Diagnosis not present

## 2016-08-04 DIAGNOSIS — I129 Hypertensive chronic kidney disease with stage 1 through stage 4 chronic kidney disease, or unspecified chronic kidney disease: Secondary | ICD-10-CM | POA: Diagnosis not present

## 2016-08-05 DIAGNOSIS — N390 Urinary tract infection, site not specified: Secondary | ICD-10-CM | POA: Diagnosis not present

## 2016-08-05 DIAGNOSIS — I059 Rheumatic mitral valve disease, unspecified: Secondary | ICD-10-CM | POA: Diagnosis not present

## 2016-08-05 DIAGNOSIS — I129 Hypertensive chronic kidney disease with stage 1 through stage 4 chronic kidney disease, or unspecified chronic kidney disease: Secondary | ICD-10-CM | POA: Diagnosis not present

## 2016-08-05 DIAGNOSIS — I739 Peripheral vascular disease, unspecified: Secondary | ICD-10-CM | POA: Diagnosis not present

## 2016-08-05 DIAGNOSIS — B9620 Unspecified Escherichia coli [E. coli] as the cause of diseases classified elsewhere: Secondary | ICD-10-CM | POA: Diagnosis not present

## 2016-08-05 DIAGNOSIS — E1122 Type 2 diabetes mellitus with diabetic chronic kidney disease: Secondary | ICD-10-CM | POA: Diagnosis not present

## 2016-08-05 DIAGNOSIS — N183 Chronic kidney disease, stage 3 (moderate): Secondary | ICD-10-CM | POA: Diagnosis not present

## 2016-08-05 DIAGNOSIS — I5022 Chronic systolic (congestive) heart failure: Secondary | ICD-10-CM | POA: Diagnosis not present

## 2016-08-05 DIAGNOSIS — I2510 Atherosclerotic heart disease of native coronary artery without angina pectoris: Secondary | ICD-10-CM | POA: Diagnosis not present

## 2016-08-05 DIAGNOSIS — Z8673 Personal history of transient ischemic attack (TIA), and cerebral infarction without residual deficits: Secondary | ICD-10-CM | POA: Diagnosis not present

## 2016-08-08 DIAGNOSIS — I059 Rheumatic mitral valve disease, unspecified: Secondary | ICD-10-CM | POA: Diagnosis not present

## 2016-08-08 DIAGNOSIS — N390 Urinary tract infection, site not specified: Secondary | ICD-10-CM | POA: Diagnosis not present

## 2016-08-08 DIAGNOSIS — I129 Hypertensive chronic kidney disease with stage 1 through stage 4 chronic kidney disease, or unspecified chronic kidney disease: Secondary | ICD-10-CM | POA: Diagnosis not present

## 2016-08-08 DIAGNOSIS — I2510 Atherosclerotic heart disease of native coronary artery without angina pectoris: Secondary | ICD-10-CM | POA: Diagnosis not present

## 2016-08-08 DIAGNOSIS — I5022 Chronic systolic (congestive) heart failure: Secondary | ICD-10-CM | POA: Diagnosis not present

## 2016-08-08 DIAGNOSIS — N183 Chronic kidney disease, stage 3 (moderate): Secondary | ICD-10-CM | POA: Diagnosis not present

## 2016-08-08 DIAGNOSIS — Z8673 Personal history of transient ischemic attack (TIA), and cerebral infarction without residual deficits: Secondary | ICD-10-CM | POA: Diagnosis not present

## 2016-08-08 DIAGNOSIS — E1122 Type 2 diabetes mellitus with diabetic chronic kidney disease: Secondary | ICD-10-CM | POA: Diagnosis not present

## 2016-08-08 DIAGNOSIS — B9620 Unspecified Escherichia coli [E. coli] as the cause of diseases classified elsewhere: Secondary | ICD-10-CM | POA: Diagnosis not present

## 2016-08-08 DIAGNOSIS — I739 Peripheral vascular disease, unspecified: Secondary | ICD-10-CM | POA: Diagnosis not present

## 2016-08-11 DIAGNOSIS — B9620 Unspecified Escherichia coli [E. coli] as the cause of diseases classified elsewhere: Secondary | ICD-10-CM | POA: Diagnosis not present

## 2016-08-11 DIAGNOSIS — I129 Hypertensive chronic kidney disease with stage 1 through stage 4 chronic kidney disease, or unspecified chronic kidney disease: Secondary | ICD-10-CM | POA: Diagnosis not present

## 2016-08-11 DIAGNOSIS — I5022 Chronic systolic (congestive) heart failure: Secondary | ICD-10-CM | POA: Diagnosis not present

## 2016-08-11 DIAGNOSIS — Z8673 Personal history of transient ischemic attack (TIA), and cerebral infarction without residual deficits: Secondary | ICD-10-CM | POA: Diagnosis not present

## 2016-08-11 DIAGNOSIS — I2510 Atherosclerotic heart disease of native coronary artery without angina pectoris: Secondary | ICD-10-CM | POA: Diagnosis not present

## 2016-08-11 DIAGNOSIS — I739 Peripheral vascular disease, unspecified: Secondary | ICD-10-CM | POA: Diagnosis not present

## 2016-08-11 DIAGNOSIS — E1122 Type 2 diabetes mellitus with diabetic chronic kidney disease: Secondary | ICD-10-CM | POA: Diagnosis not present

## 2016-08-11 DIAGNOSIS — I059 Rheumatic mitral valve disease, unspecified: Secondary | ICD-10-CM | POA: Diagnosis not present

## 2016-08-11 DIAGNOSIS — N183 Chronic kidney disease, stage 3 (moderate): Secondary | ICD-10-CM | POA: Diagnosis not present

## 2016-08-11 DIAGNOSIS — N390 Urinary tract infection, site not specified: Secondary | ICD-10-CM | POA: Diagnosis not present

## 2016-08-15 ENCOUNTER — Ambulatory Visit (HOSPITAL_COMMUNITY)
Admission: RE | Admit: 2016-08-15 | Discharge: 2016-08-15 | Disposition: A | Discharge status: Home / Self Care | Payer: Medicare HMO | Source: Ambulatory Visit | Attending: Internal Medicine | Admitting: Internal Medicine

## 2016-08-15 ENCOUNTER — Encounter (HOSPITAL_COMMUNITY): Payer: Self-pay | Admitting: Internal Medicine

## 2016-08-15 ENCOUNTER — Other Ambulatory Visit (HOSPITAL_COMMUNITY): Payer: Self-pay | Admitting: Internal Medicine

## 2016-08-15 VITALS — BP 144/56 | HR 48

## 2016-08-15 DIAGNOSIS — N183 Chronic kidney disease, stage 3 unspecified: Secondary | ICD-10-CM

## 2016-08-15 DIAGNOSIS — Z880 Allergy status to penicillin: Secondary | ICD-10-CM | POA: Insufficient documentation

## 2016-08-15 DIAGNOSIS — Z794 Long term (current) use of insulin: Secondary | ICD-10-CM | POA: Diagnosis not present

## 2016-08-15 DIAGNOSIS — R001 Bradycardia, unspecified: Secondary | ICD-10-CM | POA: Diagnosis not present

## 2016-08-15 DIAGNOSIS — I2510 Atherosclerotic heart disease of native coronary artery without angina pectoris: Secondary | ICD-10-CM

## 2016-08-15 DIAGNOSIS — Z7982 Long term (current) use of aspirin: Secondary | ICD-10-CM | POA: Insufficient documentation

## 2016-08-15 DIAGNOSIS — R0683 Snoring: Secondary | ICD-10-CM | POA: Diagnosis not present

## 2016-08-15 DIAGNOSIS — I10 Essential (primary) hypertension: Secondary | ICD-10-CM

## 2016-08-15 DIAGNOSIS — Z8546 Personal history of malignant neoplasm of prostate: Secondary | ICD-10-CM | POA: Diagnosis not present

## 2016-08-15 DIAGNOSIS — E785 Hyperlipidemia, unspecified: Secondary | ICD-10-CM | POA: Insufficient documentation

## 2016-08-15 DIAGNOSIS — Z79899 Other long term (current) drug therapy: Secondary | ICD-10-CM | POA: Diagnosis not present

## 2016-08-15 DIAGNOSIS — Z923 Personal history of irradiation: Secondary | ICD-10-CM | POA: Insufficient documentation

## 2016-08-15 DIAGNOSIS — I5022 Chronic systolic (congestive) heart failure: Secondary | ICD-10-CM

## 2016-08-15 DIAGNOSIS — E1122 Type 2 diabetes mellitus with diabetic chronic kidney disease: Secondary | ICD-10-CM | POA: Diagnosis not present

## 2016-08-15 DIAGNOSIS — Z8673 Personal history of transient ischemic attack (TIA), and cerebral infarction without residual deficits: Secondary | ICD-10-CM | POA: Insufficient documentation

## 2016-08-15 DIAGNOSIS — I130 Hypertensive heart and chronic kidney disease with heart failure and stage 1 through stage 4 chronic kidney disease, or unspecified chronic kidney disease: Secondary | ICD-10-CM | POA: Diagnosis not present

## 2016-08-15 DIAGNOSIS — Z7901 Long term (current) use of anticoagulants: Secondary | ICD-10-CM | POA: Insufficient documentation

## 2016-08-15 MED ORDER — LABETALOL HCL 100 MG PO TABS: 100.0000 mg | ORAL_TABLET | Freq: Two times a day (BID) | ORAL | 3 refills | Status: DC

## 2016-08-15 MED ORDER — LOSARTAN POTASSIUM 25 MG PO TABS: 25.0000 mg | ORAL_TABLET | Freq: Every day | ORAL | 3 refills | Status: DC

## 2016-08-15 NOTE — Progress Notes (Signed)
ADVANCED HF CLINIC CONSULT NOTE  Referring Physician: Dr. Harlan Stains Primary Cardiologist: New  HPI:  Wayne Curtis is a 76 y.o. gentleman with a history of prostate cancer S/P radiation in 2014, CVA in 2014 (he denies residual deficits), PAD s/p bilateral LE stents, HTN, HLD, IDDM, CAD and chronic systolic heart failure referred by Dr. Dema Severin for enrollment into the HF Clinic.  He was admitted to the hospital in [place] ([place]) in March for accelerated HTN with pulmonary edema and acute CHF. He had not been following with his doctors regularly and not taking his medication. Was working as a Education officer, museum. He was diuresed and BP controlled. While in the hospital had echo with EF 40% and underwent cath.    Echo 3/18 EF 40%  Cath 3/18  LM 20% LAD p30%, m40%, d60% LCX: m20% RCA: m50%  RHC:   Ao 124/46 (71) LV 137/14 RA 2 RV 22/2 PA 24/4 (12) PCW 9   After that hospitalization family moved him to [facility]. Admitted to [facility] in 5/18 for e.coli UTI and sepsis.   Now living with his daughter. Doing "ok". Walks with a cane. Had HHPT but just graduated. Now has [place] and HHOT. SBP at home 140-150s. Denies SOB.  Spends most of the day in bed. Able to lie flat in bed. No orthopnea or PND. Now going to [employer] program. No edema. Weighs everyday usually 198-199. Snores heavily. No witnessed apnea.    Review of Systems: [y] = yes, [ ]  = no   General: Weight gain [ ] ; Weight loss [ ] ; Anorexia [ ] ; Fatigue [ ] ; Fever [ ] ; Chills [ ] ; Weakness [ ]   Cardiac: Chest pain/pressure [ ] ; Resting SOB [ ] ; Exertional SOB [ ] ; Orthopnea [ ] ; Pedal Edema [ ] ; Palpitations [ ] ; Syncope [ ] ; Presyncope [ ] ; Paroxysmal nocturnal dyspnea[ ]   Pulmonary: Cough [ ] ; Wheezing[ ] ; Hemoptysis[ ] ; Sputum [ ] ; Snoring [ ]   GI: Vomiting[ ] ; Dysphagia[ ] ; Melena[ ] ; Hematochezia [ ] ; Heartburn[ ] ; Abdominal pain [ ] ; Constipation [ ] ; Diarrhea [ ] ; BRBPR [ ]   GU: Hematuria[ ] ; Dysuria [ ] ;  Nocturia[ ]   Vascular: Pain in legs with walking [ ] ; Pain in feet with lying flat [ ] ; Non-healing sores [ ] ; Stroke [ ] ; TIA [ ] ; Slurred speech [ ] ;  Neuro: Headaches[ ] ; Vertigo[ ] ; Seizures[ ] ; Paresthesias[ ] ;Blurred vision [ ] ; Diplopia [ ] ; Vision changes [ ]   Ortho/Skin: Arthritis Blue.Reese ]; Joint pain Blue.Reese ]; Muscle pain [ ] ; Joint swelling [ ] ; Back Pain [ ] ; Rash [ ]   Psych: Depression[ ] ; Anxiety[ ]   Heme: Bleeding problems [ ] ; Clotting disorders [ ] ; Anemia [ ]   Endocrine: Diabetes [ y]; Thyroid dysfunction[ ]    Past Medical History:  Diagnosis Date  . Blurred vision, left eye    Chronic  . CHF (congestive heart failure) ([place])   . CKD (chronic kidney disease)   . Coronary artery disease   . Diabetes mellitus without complication ([place])   . History of shingles   . Hyperlipidemia   . Hypertension   . Peripheral artery disease ([place])   . Prostate cancer ([place])   . Pulmonary edema   . Rheumatic mitral valve disease   . Stroke [facility])     Current Outpatient Prescriptions  Medication Sig Dispense Refill  . aspirin EC 81 MG tablet Take 81 mg by mouth daily at 12 noon.     Marland Kitchen  atorvastatin (LIPITOR) 40 MG tablet Take 40 mg by mouth at bedtime.     . clopidogrel (PLAVIX) 75 MG tablet Take 75 mg by mouth daily.    . furosemide (LASIX) 40 MG tablet Take 40 mg by mouth daily.    . Insulin Aspart (NOVOLOG FLEXPEN [place]) Inject 10 Units into the skin 3 (three) times daily before meals.     . insulin glargine (LANTUS) 100 UNIT/ML injection Inject 30 Units into the skin at bedtime.     Marland Kitchen labetalol (NORMODYNE) 200 MG tablet Take 1 tablet (200 mg total) by mouth 2 (two) times daily. 60 tablet 0  . Multiple Vitamin (MULTIVITAMIN WITH MINERALS) TABS tablet Take 1 tablet by mouth daily.    . protein supplement shake (PREMIER PROTEIN) LIQD Take 325 mLs (11 oz total) by mouth 2 (two) times daily between meals. 10 Can 0   No current facility-administered medications for this encounter.     Allergies    Allergen Reactions  . Penicillins Rash    Has patient had a PCN reaction causing immediate rash, facial/tongue/throat swelling, SOB or lightheadedness with hypotension:No Has patient had a PCN reaction causing severe rash involving mucus membranes or skin necrosis: No Has patient had a PCN reaction that required hospitalization: No Has patient had a PCN reaction occurring within the last 10 years: No If all of the above answers are "NO", then may proceed with Cephalosporin use.       Social History   Social History  . Marital status: Single    Spouse name: N/A  . Number of children: N/A  . Years of education: N/A   Occupational History  . Not on file.   Social History Main Topics  . Smoking status: Never Smoker  . Smokeless tobacco: Never Used  . Alcohol use No  . Drug use: Unknown  . Sexual activity: Not on file   Other Topics Concern  . Not on file   Social History Narrative  . No narrative on file      Family History  Problem Relation Age of Onset  . Pancreatic cancer Sister   . Stomach cancer Brother   . Benign prostatic hyperplasia Brother   . Throat cancer Brother     Vitals:   08/15/16 1143  BP: (!) 144/56  Pulse: (!) 48  SpO2: 100%    PHYSICAL EXAM: General:  Elderly Well appearing. No respiratory difficulty HEENT: normal Neck: supple. no JVD. Carotids 2+ bilat; no bruits. No lymphadenopathy or thryomegaly appreciated. Cor: PMI nondisplaced. Regular rate & rhythm. No rubs, gallops or murmurs. Lungs: clear Abdomen: soft, nontender, nondistended. No hepatosplenomegaly. No bruits or masses. Good bowel sounds. Extremities: no cyanosis, clubbing, rash, edema Neuro: alert & oriented x 3, cranial nerves grossly intact. moves all 4 extremities w/o difficulty. Affect pleasant.  ECG (07/08/16): Sinus brady 55 RBBB inferolateral TWI  Personally reviewed   ASSESSMENT & PLAN: 1. 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 Cath 3/18 with nonobstructive  CAD.  By report last EF 40% during hypertensive crisis - Suspect EF will improve with BP control.  - Decrease labetalol to 100 bid due to bradycardia  - Start losartan 25 daily - Check echo - BMET 1 week - Follow-up with PharmD in 2-3 weeks for further medication titration based on BP and results of echo 2. CAD - Non-obstructive by cath 3/18. Continue ASA/statin 3. CKD, stage III  - Last creatinine 1.5 (07/09/16). - Will use losartan for nephroprotection in setting of DM -  BMET next week   4. Bradycardia - Asymptomatic. Cut labetalol to 100 bid. May have to stop.  5. DM2 - Per PCP 6. Prostate CA 7. PAD s/p stents - No significant claudication 8. CVA - No residual. Continue ASA, Plavix and statin.  9. HTN  - BP mildly elevated. Start Losartan 10. Snoring - Refer for sleep study  Glori Bickers, MD  10:40 PM

## 2016-08-15 NOTE — Patient Instructions (Addendum)
Echocardiogram has been ordered for you.  Sleep Study has been ordered for you.  START taking Losartan 25 mg (1 Tablet) Once Daily  DECREASE labetalol to 100 mg (1 Tablet) Twice Daily  Labs in 1 week (BMET)  Follow up with Doroteo Bradford, PharmD in 2-3 Weeks  Follow up with Dr. Haroldine Laws in 3 Months.

## 2016-08-17 DIAGNOSIS — I739 Peripheral vascular disease, unspecified: Secondary | ICD-10-CM | POA: Diagnosis not present

## 2016-08-17 DIAGNOSIS — I2510 Atherosclerotic heart disease of native coronary artery without angina pectoris: Secondary | ICD-10-CM | POA: Diagnosis not present

## 2016-08-17 DIAGNOSIS — B9620 Unspecified Escherichia coli [E. coli] as the cause of diseases classified elsewhere: Secondary | ICD-10-CM | POA: Diagnosis not present

## 2016-08-17 DIAGNOSIS — Z8673 Personal history of transient ischemic attack (TIA), and cerebral infarction without residual deficits: Secondary | ICD-10-CM | POA: Diagnosis not present

## 2016-08-17 DIAGNOSIS — N183 Chronic kidney disease, stage 3 (moderate): Secondary | ICD-10-CM | POA: Diagnosis not present

## 2016-08-17 DIAGNOSIS — N390 Urinary tract infection, site not specified: Secondary | ICD-10-CM | POA: Diagnosis not present

## 2016-08-17 DIAGNOSIS — I5022 Chronic systolic (congestive) heart failure: Secondary | ICD-10-CM | POA: Diagnosis not present

## 2016-08-17 DIAGNOSIS — E1122 Type 2 diabetes mellitus with diabetic chronic kidney disease: Secondary | ICD-10-CM | POA: Diagnosis not present

## 2016-08-17 DIAGNOSIS — I059 Rheumatic mitral valve disease, unspecified: Secondary | ICD-10-CM | POA: Diagnosis not present

## 2016-08-17 DIAGNOSIS — I129 Hypertensive chronic kidney disease with stage 1 through stage 4 chronic kidney disease, or unspecified chronic kidney disease: Secondary | ICD-10-CM | POA: Diagnosis not present

## 2016-08-18 ENCOUNTER — Ambulatory Visit (HOSPITAL_COMMUNITY)
Admission: RE | Admit: 2016-08-18 | Discharge: 2016-08-18 | Disposition: A | Discharge status: Home / Self Care | Payer: Medicare HMO | Source: Ambulatory Visit | Attending: Family Medicine | Admitting: Family Medicine

## 2016-08-18 DIAGNOSIS — I5022 Chronic systolic (congestive) heart failure: Secondary | ICD-10-CM | POA: Diagnosis not present

## 2016-08-18 LAB — ECHOCARDIOGRAM COMPLETE
AVLVOTPG: 3 mmHg
CHL CUP TV REG PEAK VELOCITY: 252 cm/s
E/e' ratio: 8.53
FS: 40 % (ref 28–44)
IVS/LV PW RATIO, ED: 0.91
LA diam end sys: 45 mm
LA diam index: 2.11 cm/m2
LA vol index: 41.9 mL/m2
LA vol: 89.2 mL
LASIZE: 45 mm
LAVOLA4C: 99.8 mL
LV PW d: 11 mm — AB (ref 0.6–1.1)
LV TDI E'LATERAL: 6.53
LV TDI E'MEDIAL: 5
LV dias vol index: 52 mL/m2
LV e' LATERAL: 6.53 cm/s
LVDIAVOL: 110 mL (ref 62–150)
LVEEAVG: 8.53
LVEEMED: 8.53
LVOT SV: 90 mL
LVOT VTI: 25.9 cm
LVOT area: 3.46 cm2
LVOT peak vel: 89.4 cm/s
LVOTD: 21 mm
LVSYSVOL: 52 mL (ref 21–61)
LVSYSVOLIN: 25 mL/m2
MV pk E vel: 55.7 m/s
MVPKAVEL: 86.1 m/s
RV LATERAL S' VELOCITY: 12.7 cm/s
RV TAPSE: 26.7 mm
RV sys press: 28 mmHg
Simpson's disk: 53
Stroke v: 58 ml
TRMAXVEL: 252 cm/s

## 2016-08-18 NOTE — Progress Notes (Signed)
  Echocardiogram 2D Echocardiogram has been performed.  Wayne Curtis 08/18/2016, 1:36 PM

## 2016-08-19 DIAGNOSIS — I2510 Atherosclerotic heart disease of native coronary artery without angina pectoris: Secondary | ICD-10-CM | POA: Diagnosis not present

## 2016-08-19 DIAGNOSIS — B9620 Unspecified Escherichia coli [E. coli] as the cause of diseases classified elsewhere: Secondary | ICD-10-CM | POA: Diagnosis not present

## 2016-08-19 DIAGNOSIS — I059 Rheumatic mitral valve disease, unspecified: Secondary | ICD-10-CM | POA: Diagnosis not present

## 2016-08-19 DIAGNOSIS — N390 Urinary tract infection, site not specified: Secondary | ICD-10-CM | POA: Diagnosis not present

## 2016-08-19 DIAGNOSIS — Z8673 Personal history of transient ischemic attack (TIA), and cerebral infarction without residual deficits: Secondary | ICD-10-CM | POA: Diagnosis not present

## 2016-08-19 DIAGNOSIS — I739 Peripheral vascular disease, unspecified: Secondary | ICD-10-CM | POA: Diagnosis not present

## 2016-08-19 DIAGNOSIS — N183 Chronic kidney disease, stage 3 (moderate): Secondary | ICD-10-CM | POA: Diagnosis not present

## 2016-08-19 DIAGNOSIS — I5022 Chronic systolic (congestive) heart failure: Secondary | ICD-10-CM | POA: Diagnosis not present

## 2016-08-19 DIAGNOSIS — E1122 Type 2 diabetes mellitus with diabetic chronic kidney disease: Secondary | ICD-10-CM | POA: Diagnosis not present

## 2016-08-19 DIAGNOSIS — I129 Hypertensive chronic kidney disease with stage 1 through stage 4 chronic kidney disease, or unspecified chronic kidney disease: Secondary | ICD-10-CM | POA: Diagnosis not present

## 2016-08-22 ENCOUNTER — Telehealth (HOSPITAL_COMMUNITY): Payer: Self-pay | Admitting: Vascular Surgery

## 2016-08-22 ENCOUNTER — Ambulatory Visit (HOSPITAL_COMMUNITY)
Admission: RE | Admit: 2016-08-22 | Discharge: 2016-08-22 | Disposition: A | Discharge status: Home / Self Care | Payer: Medicare HMO | Source: Ambulatory Visit | Attending: Internal Medicine | Admitting: Internal Medicine

## 2016-08-22 DIAGNOSIS — I5022 Chronic systolic (congestive) heart failure: Secondary | ICD-10-CM | POA: Diagnosis not present

## 2016-08-22 LAB — BASIC METABOLIC PANEL
ANION GAP: 8 (ref 5–15)
BUN: 36 mg/dL — AB (ref 6–20)
CALCIUM: 9 mg/dL (ref 8.9–10.3)
CO2: 24 mmol/L (ref 22–32)
Chloride: 105 mmol/L (ref 101–111)
Creatinine, Ser: 1.61 mg/dL — ABNORMAL HIGH (ref 0.61–1.24)
GFR calc Af Amer: 46 mL/min — ABNORMAL LOW (ref >60)
GFR, EST NON AFRICAN AMERICAN: 40 mL/min — AB (ref >60)
Glucose, Bld: 220 mg/dL — ABNORMAL HIGH (ref 65–99)
Potassium: 4.3 mmol/L (ref 3.5–5.1)
SODIUM: 137 mmol/L (ref 135–145)

## 2016-08-22 NOTE — Telephone Encounter (Signed)
Encounter opened in error

## 2016-08-23 DIAGNOSIS — N3941 Urge incontinence: Secondary | ICD-10-CM | POA: Diagnosis not present

## 2016-08-23 DIAGNOSIS — Z8546 Personal history of malignant neoplasm of prostate: Secondary | ICD-10-CM | POA: Diagnosis not present

## 2016-08-24 DIAGNOSIS — E1122 Type 2 diabetes mellitus with diabetic chronic kidney disease: Secondary | ICD-10-CM | POA: Diagnosis not present

## 2016-08-24 DIAGNOSIS — I129 Hypertensive chronic kidney disease with stage 1 through stage 4 chronic kidney disease, or unspecified chronic kidney disease: Secondary | ICD-10-CM | POA: Diagnosis not present

## 2016-08-24 DIAGNOSIS — I739 Peripheral vascular disease, unspecified: Secondary | ICD-10-CM | POA: Diagnosis not present

## 2016-08-24 DIAGNOSIS — I2510 Atherosclerotic heart disease of native coronary artery without angina pectoris: Secondary | ICD-10-CM | POA: Diagnosis not present

## 2016-08-24 DIAGNOSIS — B9620 Unspecified Escherichia coli [E. coli] as the cause of diseases classified elsewhere: Secondary | ICD-10-CM | POA: Diagnosis not present

## 2016-08-24 DIAGNOSIS — N390 Urinary tract infection, site not specified: Secondary | ICD-10-CM | POA: Diagnosis not present

## 2016-08-24 DIAGNOSIS — I059 Rheumatic mitral valve disease, unspecified: Secondary | ICD-10-CM | POA: Diagnosis not present

## 2016-08-24 DIAGNOSIS — N183 Chronic kidney disease, stage 3 (moderate): Secondary | ICD-10-CM | POA: Diagnosis not present

## 2016-08-24 DIAGNOSIS — Z8673 Personal history of transient ischemic attack (TIA), and cerebral infarction without residual deficits: Secondary | ICD-10-CM | POA: Diagnosis not present

## 2016-08-24 DIAGNOSIS — I5022 Chronic systolic (congestive) heart failure: Secondary | ICD-10-CM | POA: Diagnosis not present

## 2016-08-31 ENCOUNTER — Ambulatory Visit (HOSPITAL_COMMUNITY)
Admission: RE | Admit: 2016-08-31 | Discharge: 2016-08-31 | Disposition: A | Discharge status: Home / Self Care | Payer: Medicare HMO | Source: Ambulatory Visit | Attending: Internal Medicine | Admitting: Internal Medicine

## 2016-08-31 DIAGNOSIS — Z7902 Long term (current) use of antithrombotics/antiplatelets: Secondary | ICD-10-CM | POA: Insufficient documentation

## 2016-08-31 DIAGNOSIS — I2510 Atherosclerotic heart disease of native coronary artery without angina pectoris: Secondary | ICD-10-CM | POA: Diagnosis not present

## 2016-08-31 DIAGNOSIS — E785 Hyperlipidemia, unspecified: Secondary | ICD-10-CM | POA: Insufficient documentation

## 2016-08-31 DIAGNOSIS — Z7982 Long term (current) use of aspirin: Secondary | ICD-10-CM | POA: Diagnosis not present

## 2016-08-31 DIAGNOSIS — I130 Hypertensive heart and chronic kidney disease with heart failure and stage 1 through stage 4 chronic kidney disease, or unspecified chronic kidney disease: Secondary | ICD-10-CM | POA: Diagnosis not present

## 2016-08-31 DIAGNOSIS — N183 Chronic kidney disease, stage 3 (moderate): Secondary | ICD-10-CM | POA: Diagnosis not present

## 2016-08-31 DIAGNOSIS — Z8673 Personal history of transient ischemic attack (TIA), and cerebral infarction without residual deficits: Secondary | ICD-10-CM | POA: Diagnosis not present

## 2016-08-31 DIAGNOSIS — E1122 Type 2 diabetes mellitus with diabetic chronic kidney disease: Secondary | ICD-10-CM | POA: Insufficient documentation

## 2016-08-31 DIAGNOSIS — Z8546 Personal history of malignant neoplasm of prostate: Secondary | ICD-10-CM | POA: Insufficient documentation

## 2016-08-31 DIAGNOSIS — I5022 Chronic systolic (congestive) heart failure: Secondary | ICD-10-CM | POA: Diagnosis not present

## 2016-08-31 DIAGNOSIS — N3941 Urge incontinence: Secondary | ICD-10-CM | POA: Diagnosis not present

## 2016-08-31 DIAGNOSIS — Z923 Personal history of irradiation: Secondary | ICD-10-CM | POA: Diagnosis not present

## 2016-08-31 DIAGNOSIS — Z79899 Other long term (current) drug therapy: Secondary | ICD-10-CM | POA: Diagnosis not present

## 2016-08-31 MED ORDER — LOSARTAN POTASSIUM 50 MG PO TABS: 50.0000 mg | ORAL_TABLET | Freq: Every day | ORAL | 5 refills | Status: DC

## 2016-08-31 NOTE — Patient Instructions (Addendum)
Thanks for coming to see Wayne Curtis today!   CHANGE how you're taking your fluid pill (furosemide) to once in the MORNING so you don't have to pee so often at night.   INCREASE your Losartan to 50 mg once a day. You can take two of your losartan 25 mg tablets until you run out, then pick up the new strength at the pharmacy.   Come back to see the pharmacist on 7/9 at 1:15pm. We will get labs then. Take your medications like you normally would that day.

## 2016-08-31 NOTE — Progress Notes (Signed)
MD: Dr. Haroldine Laws  HPI:   Wayne Curtis a pleasant 76 y.o.AA gentleman with a history of prostate cancer S/P radiation in 2014, CVA in 2014 (he denies residual deficits), PAD s/p bilateral LE stents, HTN, HLD, IDDM, CAD and chronic systolic heart failure. He was admitted to OSH in [place] ([facility]) in March, 2018 for accelerated HTN with pulmonary edema and acute CHF. He had not been following with his doctors regularly and not taking his medication. Was working as a Education officer, museum. He was diuresed and BP controlled. While in the hospital had echo with EF 40% and underwent cath.   After that hospitalization family moved him to [facility]. Admitted to [facility] in 5/18 for e.coli urosepsis.   Most recent f/u with Dr. Haroldine Laws on 08/15/16. At that time, patient was doing "OK" and was able to lie flat in bed. No orthopnea or PND. Had started going to [employer] program. No edema. Weighed everyday usually 198-199 lbs.  At this visit, labetalol was decreased from 200 mg BID to 100 mg BID 2/2 bradycardia (HR 48) and started losartan 25 mg QD.  Follow up BMET on 6/18 showed mild increase in SCr, K of 4.3.   Today, Wayne Curtis presents for pharmacy led medication titration visit.  He is accompanied by granddaughter and brings all pill bottles. Per granddaughter, pt's wife puts pills in plastic baggies with the name of medication and administration time. Patient took AM meds but missed 2PM dose of labetolol which he took during this visit. Otherwise, endorses good adherence to medications. Pt states he takes furosemide QHS, and endorses nocturia 2-3 times nightly. Denies SOB since discharge from hospital in March of 2018. Had dizziness x1 in the last month while standing up, otherwise denies. Denies edema and is non-edematous on exam. Endorses low-salt diet and is adherent to fluid restrictions. Daughter states that he has had decreased PO intake and may have lost some weight recently.     Marland Kitchen Shortness of  breath/dyspnea on exertion? no  . Orthopnea/PND? Yes - 2 pillows, stable.  . Edema? no . Lightheadedness/dizziness? no . Daily weights at home? Yes - stable ~198-199 lb . Blood pressure/heart rate monitoring at home? Yes - doesn't remember  . Following low-sodium/fluid-restricted diet? yes  HF Medications: Furosemide 40 mg PO daily Labetalol 100 mg PO BID Losartan 25 mg PO daily  Has the patient been experiencing any side effects to the medications prescribed?  no  Does the patient have any problems obtaining medications due to transportation or finances?   no  Understanding of regimen: good Understanding of indications: good Potential of compliance: good Patient understands to avoid NSAIDs. Patient understands to avoid decongestants.    Pertinent Lab Values: . 08/22/16: Serum creatinine 1.61 (up slightly from 1.49 on 5/5 after starting losartan 6/11), CO2 24, Potassium 4.3, Sodium 137  Vital Signs: . Weight: 193.4 lbs (dry weight: ~198-199 at home, stable)  . Blood pressure: 152/74 mmHg (due for labetalol dose)  . Heart rate: 70 bpm  Assessment: 1. 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  - NYHA class IIsymptoms. - Pt appears euvolemic on exam - Increase losartan to 50 mg daily - Continue labetalol 100 BID and furosemide 40 mg daily. Advised to take furosemide in the morning to avoid nocturia.  - Basic disease state pathophysiology, medication indication, mechanism and side effects reviewed at length with patient and he verbalized understanding. 2. CAD - Non-obstructive by cath 3/18. Continues on ASA/statin 3.  CKD, stage III  - Continues on losartan (increasing dose) for nephroprotection in setting of DM - BMET in 1-2 weeks after losartan dose increase 4. DM2 - Per PCP 5. Prostate CA - S/p radiation in 2014 6. PAD s/p stents - No significant claudication reported 7. CVA - No residual deficits. Continues on ASA, Plavix and statin.   8. HTN  - BP elevated above goal < 130/80 mmHg - Increase losartan as above - BMET as above   Plan: 1) Medication changes: Based on clinical presentation, vital signs and recent labs will increase losartan to 50 mg daily. Advised patient to take furosemide in the morning to avoid nocturia.  2) Labs: BMET 09/12/16.  3) Follow-up: Pharmacy Clinic visit 09/12/2016.   Patient seen with: Deirdre Pippins, PharmD, PGY1 Pharmacy Resident Phillis Knack, PharmD Candidate    [name]. Velva Harman, PharmD, BCPS, CPP Clinical Pharmacist Pager: [phone] Phone: [phone] 08/31/2016 2:56 PM

## 2016-09-12 ENCOUNTER — Ambulatory Visit (HOSPITAL_COMMUNITY): Payer: Medicare HMO

## 2016-09-13 DIAGNOSIS — I5022 Chronic systolic (congestive) heart failure: Secondary | ICD-10-CM | POA: Diagnosis not present

## 2016-09-13 DIAGNOSIS — E1121 Type 2 diabetes mellitus with diabetic nephropathy: Secondary | ICD-10-CM | POA: Diagnosis not present

## 2016-09-13 DIAGNOSIS — E785 Hyperlipidemia, unspecified: Secondary | ICD-10-CM | POA: Diagnosis not present

## 2016-09-13 DIAGNOSIS — E1165 Type 2 diabetes mellitus with hyperglycemia: Secondary | ICD-10-CM | POA: Diagnosis not present

## 2016-09-13 DIAGNOSIS — Z794 Long term (current) use of insulin: Secondary | ICD-10-CM | POA: Diagnosis not present

## 2016-09-13 DIAGNOSIS — R0989 Other specified symptoms and signs involving the circulatory and respiratory systems: Secondary | ICD-10-CM | POA: Diagnosis not present

## 2016-09-13 DIAGNOSIS — K5900 Constipation, unspecified: Secondary | ICD-10-CM | POA: Diagnosis not present

## 2016-09-13 DIAGNOSIS — I129 Hypertensive chronic kidney disease with stage 1 through stage 4 chronic kidney disease, or unspecified chronic kidney disease: Secondary | ICD-10-CM | POA: Diagnosis not present

## 2016-09-13 DIAGNOSIS — E1151 Type 2 diabetes mellitus with diabetic peripheral angiopathy without gangrene: Secondary | ICD-10-CM | POA: Diagnosis not present

## 2016-09-14 ENCOUNTER — Telehealth (HOSPITAL_COMMUNITY): Payer: Self-pay | Admitting: Pharmacist

## 2016-09-14 NOTE — Telephone Encounter (Signed)
Received a message from Dr. Vidal Schwalbe office at [place] that Mr. Wayne Curtis elevated to 2.25 from 1.61 on 6/18. They advised Mr. Wayne Curtis to stop his losartan that was recently increased from 25 mg daily to 50 mg daily. After discussion with [name], spoke with Mr. Wayne Curtis and plan was to restart the lower dose of losartan 25 mg daily that he was stable on with recheck BMET in 1 week but Mr. Wayne Curtis is on his way to [facility] and did not bring his losartan with him. He states that he will have his daughter call us to set up a f/u BMET after [place] from the beach on [place]17.   [name]. Velva Harman, PharmD, BCPS, CPP Clinical Pharmacist Pager: [phone] Phone: [phone] 09/14/2016 11:55 AM

## 2016-09-16 ENCOUNTER — Other Ambulatory Visit: Payer: Self-pay | Admitting: Family Medicine

## 2016-09-16 DIAGNOSIS — R0989 Other specified symptoms and signs involving the circulatory and respiratory systems: Secondary | ICD-10-CM

## 2016-09-20 ENCOUNTER — Other Ambulatory Visit (HOSPITAL_COMMUNITY): Payer: Self-pay | Admitting: Internal Medicine

## 2016-09-22 ENCOUNTER — Ambulatory Visit
Admission: RE | Admit: 2016-09-22 | Discharge: 2016-09-22 | Disposition: A | Discharge status: Home / Self Care | Payer: Medicare HMO | Source: Ambulatory Visit | Attending: Family Medicine | Admitting: Family Medicine

## 2016-09-22 DIAGNOSIS — I6523 Occlusion and stenosis of bilateral carotid arteries: Secondary | ICD-10-CM | POA: Diagnosis not present

## 2016-09-22 DIAGNOSIS — I129 Hypertensive chronic kidney disease with stage 1 through stage 4 chronic kidney disease, or unspecified chronic kidney disease: Secondary | ICD-10-CM | POA: Diagnosis not present

## 2016-09-22 DIAGNOSIS — M25561 Pain in right knee: Secondary | ICD-10-CM | POA: Diagnosis not present

## 2016-09-22 DIAGNOSIS — M25511 Pain in right shoulder: Secondary | ICD-10-CM | POA: Diagnosis not present

## 2016-09-22 DIAGNOSIS — I6522 Occlusion and stenosis of left carotid artery: Secondary | ICD-10-CM | POA: Diagnosis not present

## 2016-09-22 DIAGNOSIS — R0989 Other specified symptoms and signs involving the circulatory and respiratory systems: Secondary | ICD-10-CM

## 2016-09-22 DIAGNOSIS — M25551 Pain in right hip: Secondary | ICD-10-CM | POA: Diagnosis not present

## 2016-09-22 DIAGNOSIS — N183 Chronic kidney disease, stage 3 (moderate): Secondary | ICD-10-CM | POA: Diagnosis not present

## 2016-09-23 DIAGNOSIS — H401123 Primary open-angle glaucoma, left eye, severe stage: Secondary | ICD-10-CM | POA: Diagnosis not present

## 2016-09-23 DIAGNOSIS — H35372 Puckering of macula, left eye: Secondary | ICD-10-CM | POA: Diagnosis not present

## 2016-09-23 DIAGNOSIS — E119 Type 2 diabetes mellitus without complications: Secondary | ICD-10-CM | POA: Diagnosis not present

## 2016-09-28 ENCOUNTER — Ambulatory Visit: Payer: PRIVATE HEALTH INSURANCE | Admitting: Podiatry

## 2016-10-02 ENCOUNTER — Ambulatory Visit (HOSPITAL_BASED_OUTPATIENT_CLINIC_OR_DEPARTMENT_OTHER): Payer: Medicare HMO | Attending: Internal Medicine | Admitting: Cardiology

## 2016-10-02 DIAGNOSIS — Z79899 Other long term (current) drug therapy: Secondary | ICD-10-CM | POA: Insufficient documentation

## 2016-10-02 DIAGNOSIS — G4733 Obstructive sleep apnea (adult) (pediatric): Secondary | ICD-10-CM | POA: Insufficient documentation

## 2016-10-02 DIAGNOSIS — I5022 Chronic systolic (congestive) heart failure: Secondary | ICD-10-CM | POA: Insufficient documentation

## 2016-10-02 DIAGNOSIS — Z794 Long term (current) use of insulin: Secondary | ICD-10-CM | POA: Insufficient documentation

## 2016-10-03 NOTE — Procedures (Signed)
   Patient Name: Wayne Curtis, Wayne Curtis Date: 10/02/2016 Gender: Male D.O.B: 06-11-1940 Age (years): 75 Referring Provider: Shaune Pascal Bensimhon Height (inches): 71 Interpreting Physician: Fransico Him MD, ABSM Weight (lbs): 206 RPSGT: Earney [place] BMI: 29 MRN: [id number] Neck Size: 17.00  CLINICAL INFORMATION Sleep Study Type: NPSG  Indication for sleep study: N/A  Epworth Sleepiness Score: 4  SLEEP STUDY TECHNIQUE As per the AASM Manual for the Scoring of Sleep and Associated Events v2.3 (April 2016) with a hypopnea requiring 4% desaturations.  The channels recorded and monitored were frontal, central and occipital EEG, electrooculogram (EOG), submentalis EMG (chin), nasal and oral airflow, thoracic and abdominal wall motion, anterior tibialis EMG, snore microphone, electrocardiogram, and pulse oximetry.  MEDICATIONS Medications self-administered by patient taken the night of the study : ATORVASTATIN, LANTUS, LASIX, LATANOPROST OPHTHALMIC  SLEEP ARCHITECTURE The study was initiated at 9:50:41 PM and ended at 4:33:28 AM.  Sleep onset time was 4.7 minutes and the sleep efficiency was 65.7%. The total sleep time was 264.6 minutes.  Stage REM latency was 333.5 minutes.  The patient spent 9.07% of the night in stage N1 sleep, 88.85% in stage N2 sleep, 0.00% in stage N3 and 2.08% in REM.  Alpha intrusion was absent.  Supine sleep was 100.00%.  RESPIRATORY PARAMETERS The overall apnea/hypopnea index (AHI) was 7.5 per hour. There were 10 total apneas, including 10 obstructive, 0 central and 0 mixed apneas. There were 23 hypopneas and 27 RERAs.  The AHI during Stage REM sleep was 0.0 per hour.  AHI while supine was 7.5 per hour.  The mean oxygen saturation was 95.39%. The minimum SpO2 during sleep was 89.00%.  Moderate snoring was noted during this study.  CARDIAC DATA The 2 lead EKG demonstrated sinus rhythm. The mean heart rate was 56.55 beats per minute. Other  EKG findings include: Frequent PVCs  LEG MOVEMENT DATA The total PLMS were 136 with a resulting PLMS index of 30.84. Associated arousal with leg movement index was 1.1 .  IMPRESSIONS - Mild obstructive sleep apnea occurred during this study (AHI = 7.5/h). - No significant central sleep apnea occurred during this study (CAI = 0.0/h). - Oxygen desaturation was noted during this study (Min O2 = 89.00%). - The patient snored with Moderate snoring volume. - Frequent PVCs were noted during this study. 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 No significant associated arousals.  DIAGNOSIS - Obstructive Sleep Apnea (327.23 [G47.33 ICD-10])  RECOMMENDATIONS - Very mild obstructive sleep apnea. Return to discuss treatment options. - Avoid alcohol, sedatives and other CNS depressants that may worsen sleep apnea and disrupt normal sleep architecture. - Sleep hygiene should be reviewed to assess factors that may improve sleep quality. - Weight management and regular exercise should be initiated or continued if appropriate.  [place], American Board of Sleep Medicine  ELECTRONICALLY SIGNED ON:  10/03/2016, 5:28 PM [place] PH: [phone]   FX: [phone] [place]

## 2016-10-06 ENCOUNTER — Other Ambulatory Visit (HOSPITAL_COMMUNITY): Payer: Self-pay | Admitting: Internal Medicine

## 2016-10-06 ENCOUNTER — Telehealth: Payer: Self-pay | Admitting: *Deleted

## 2016-10-06 DIAGNOSIS — Z961 Presence of intraocular lens: Secondary | ICD-10-CM | POA: Diagnosis not present

## 2016-10-06 DIAGNOSIS — H40013 Open angle with borderline findings, low risk, bilateral: Secondary | ICD-10-CM | POA: Diagnosis not present

## 2016-10-06 DIAGNOSIS — E119 Type 2 diabetes mellitus without complications: Secondary | ICD-10-CM | POA: Diagnosis not present

## 2016-10-06 DIAGNOSIS — H04123 Dry eye syndrome of bilateral lacrimal glands: Secondary | ICD-10-CM | POA: Diagnosis not present

## 2016-10-06 NOTE — Telephone Encounter (Signed)
-----   Message from Sueanne Margarita, MD sent at 10/03/2016  5:31 PM EDT ----- Patient has very mild OSA - set up OV to discuss treatment options.

## 2016-10-06 NOTE — Telephone Encounter (Signed)
Informed patient's daughter and caretaker Davy Pique) of sleep study results and she understanding was verbalized. Patient has an appointment scheduled to discuss treatment options with Dr Radford Pax on October 11 at 8:20 am. Davy Pique will discuss the results and the appointment with the patient. Davy Pique was grateful for the call and thanked me.

## 2016-11-03 ENCOUNTER — Encounter: Payer: Self-pay | Admitting: Cardiology

## 2016-11-21 ENCOUNTER — Encounter: Payer: Self-pay | Admitting: Cardiology

## 2016-11-21 ENCOUNTER — Ambulatory Visit: Payer: Medicare HMO | Admitting: Cardiology

## 2016-11-21 DIAGNOSIS — N3941 Urge incontinence: Secondary | ICD-10-CM | POA: Diagnosis not present

## 2016-11-21 DIAGNOSIS — G4733 Obstructive sleep apnea (adult) (pediatric): Secondary | ICD-10-CM | POA: Insufficient documentation

## 2016-11-21 HISTORY — DX: Obstructive sleep apnea (adult) (pediatric): G47.33

## 2016-11-21 NOTE — Progress Notes (Deleted)
Cardiology Office Note:    Date:  11/21/2016   ID:  Wayne Curtis, DOB [birth year], MRN [id number]  PCP:  Harlan Stains, MD  Cardiologist:  Fransico Him, MD   Referring MD: Harlan Stains, MD   No chief complaint on file.   History of Present Illness:    Wayne Curtis is a 76 y.o. male with a hx of of CHF, CKD, DM and CAD who was referred by Dr. Haroldine Laws for evaluation of .  Sleep study showed very mild OSA with an AHI of 7.5/hr with oxygen desaturations as low as 89%.  He was noted to have moderate snoring during the study.    Past Medical History:  Diagnosis Date  . Blurred vision, left eye    Chronic  . CHF (congestive heart failure) ([place])   . CKD (chronic kidney disease)   . Coronary artery disease   . Diabetes mellitus without complication ([place])   . History of shingles   . Hyperlipidemia   . Hypertension   . OSA (obstructive sleep apnea) 11/21/2016   Mild with AHI 7.5/hr  . Peripheral artery disease ([place])   . Prostate cancer ([place])   . Pulmonary edema   . Rheumatic mitral valve disease   . Stroke [facility])     Past Surgical History:  Procedure Laterality Date  . CATARACT EXTRACTION, BILATERAL      Current Medications: No outpatient prescriptions have been marked as taking for the 11/21/16 encounter (Appointment) with Sueanne Margarita, MD.     Allergies:   Penicillins   Social History   Social History  . Marital status: Single    Spouse name: N/A  . Number of children: N/A  . Years of education: N/A   Social History Main Topics  . Smoking status: Never Smoker  . Smokeless tobacco: Never Used  . Alcohol use No  . Drug use: Unknown  . Sexual activity: Not on file   Other Topics Concern  . Not on file   Social History Narrative  . No narrative on file     Family History: The patient's family history includes Benign prostatic hyperplasia in his brother; Pancreatic cancer in his sister; Stomach cancer in his brother; Throat cancer in his brother.  ROS:    Please see the history of present illness.     All other systems reviewed and are negative.  EKGs/Labs/Other Studies Reviewed:    The following studies were reviewed today: Sleep study  EKG:  EKG is not ordered today.    Recent Labs: 06/13/2016: ALT 16 07/09/2016: Hemoglobin 11.2; Platelets 217 08/22/2016: BUN 36; Creatinine, Ser 1.61; Potassium 4.3; Sodium 137   Recent Lipid Panel No results found for: CHOL, TRIG, HDL, CHOLHDL, VLDL, LDLCALC, LDLDIRECT  Physical Exam:    VS:  There were no vitals taken for this visit.    Wt Readings from Last 3 Encounters:  10/02/16 205 lb 9.6 oz (93.3 kg)  08/31/16 193 lb 6.4 oz (87.7 kg)  07/12/16 202 lb (91.6 kg)     GEN:  Well nourished, well developed in no acute distress HEENT: Normal NECK: No JVD; No carotid bruits LYMPHATICS: No lymphadenopathy CARDIAC: RRR, no murmurs, rubs, gallops RESPIRATORY:  Clear to auscultation without rales, wheezing or rhonchi  ABDOMEN: Soft, non-tender, non-distended MUSCULOSKELETAL:  No edema; No deformity  SKIN: Warm and dry NEUROLOGIC:  Alert and oriented x 3 PSYCHIATRIC:  Normal affect   ASSESSMENT:    1. OSA (obstructive sleep apnea)   2. Essential hypertension  PLAN:    In order of problems listed above:  1. ***   Medication Adjustments/Labs and Tests Ordered: Current medicines are reviewed at length with the patient today.  Concerns regarding medicines are outlined above.  No orders of the defined types were placed in this encounter.  No orders of the defined types were placed in this encounter.   Signed, Fransico Him, MD  11/21/2016 12:23 PM    [place]

## 2016-11-24 ENCOUNTER — Ambulatory Visit: Payer: Medicare HMO | Admitting: Physician Assistant

## 2016-11-25 ENCOUNTER — Encounter: Payer: Medicare HMO | Admitting: Vascular Surgery

## 2016-11-25 DIAGNOSIS — E119 Type 2 diabetes mellitus without complications: Secondary | ICD-10-CM | POA: Diagnosis not present

## 2016-11-25 DIAGNOSIS — H04123 Dry eye syndrome of bilateral lacrimal glands: Secondary | ICD-10-CM | POA: Diagnosis not present

## 2016-11-25 DIAGNOSIS — H401112 Primary open-angle glaucoma, right eye, moderate stage: Secondary | ICD-10-CM | POA: Diagnosis not present

## 2016-11-25 DIAGNOSIS — Z961 Presence of intraocular lens: Secondary | ICD-10-CM | POA: Diagnosis not present

## 2016-11-28 ENCOUNTER — Ambulatory Visit: Payer: Medicare HMO | Admitting: Cardiology

## 2016-11-28 DIAGNOSIS — R69 Illness, unspecified: Secondary | ICD-10-CM | POA: Diagnosis not present

## 2016-11-28 NOTE — Progress Notes (Deleted)
Cardiology Office Note:    Date:  11/28/2016   ID:  Wayne Curtis, DOB 29-Oct-1940, MRN [id number]  PCP:  Harlan Stains, MD  Cardiologist:  Fransico Him, MD   Referring MD: Harlan Stains, MD   No chief complaint on file.   History of Present Illness:    Wayne Curtis is a 76 y.o. male with a hx of CAD and chronic systolic CHF followed by Dr. Haroldine Laws who was referred for PSG due to excessive snoring. He underwent PSG showing mild OSA with an AHI of 7.5/hr and O2 sats as low as 89%.  He is now here to discuss results.    Past Medical History:  Diagnosis Date  . Blurred vision, left eye    Chronic  . CHF (congestive heart failure) ([place])   . CKD (chronic kidney disease)   . Coronary artery disease   . Diabetes mellitus without complication ([place])   . History of shingles   . Hyperlipidemia   . Hypertension   . OSA (obstructive sleep apnea) 11/21/2016   Mild with AHI 7.5/hr  . Peripheral artery disease ([place])   . Prostate cancer ([place])   . Pulmonary edema   . Rheumatic mitral valve disease   . Stroke [facility])     Past Surgical History:  Procedure Laterality Date  . CATARACT EXTRACTION, BILATERAL      Current Medications: No outpatient prescriptions have been marked as taking for the 11/28/16 encounter (Appointment) with Sueanne Margarita, MD.     Allergies:   Penicillins   Social History   Social History  . Marital status: Single    Spouse name: N/A  . Number of children: N/A  . Years of education: N/A   Social History Main Topics  . Smoking status: Never Smoker  . Smokeless tobacco: Never Used  . Alcohol use No  . Drug use: Unknown  . Sexual activity: Not on file   Other Topics Concern  . Not on file   Social History Narrative  . No narrative on file     Family History: The patient's family history includes Benign prostatic hyperplasia in his brother; Pancreatic cancer in his sister; Stomach cancer in his brother; Throat cancer in his brother.  ROS:     Please see the history of present illness.    ROS  All other systems reviewed and negative.   EKGs/Labs/Other Studies Reviewed:    The following studies were reviewed today: PSG study  EKG:  EKG is not ordered today.    Recent Labs: 06/13/2016: ALT 16 07/09/2016: Hemoglobin 11.2; Platelets 217 08/22/2016: BUN 36; Creatinine, Ser 1.61; Potassium 4.3; Sodium 137   Recent Lipid Panel No results found for: CHOL, TRIG, HDL, CHOLHDL, VLDL, LDLCALC, LDLDIRECT  Physical Exam:    VS:  There were no vitals taken for this visit.    Wt Readings from Last 3 Encounters:  10/02/16 205 lb 9.6 oz (93.3 kg)  08/31/16 193 lb 6.4 oz (87.7 kg)  07/12/16 202 lb (91.6 kg)     GEN:  Well nourished, well developed in no acute distress HEENT: Normal NECK: No JVD; No carotid bruits LYMPHATICS: No lymphadenopathy CARDIAC: RRR, no murmurs, rubs, gallops RESPIRATORY:  Clear to auscultation without rales, wheezing or rhonchi  ABDOMEN: Soft, non-tender, non-distended MUSCULOSKELETAL:  No edema; No deformity  SKIN: Warm and dry NEUROLOGIC:  Alert and oriented x 3 PSYCHIATRIC:  Normal affect   ASSESSMENT:    1. OSA (obstructive sleep apnea)   2. Essential hypertension  PLAN:    In order of problems listed above:  ***   Medication Adjustments/Labs and Tests Ordered: Current medicines are reviewed at length with the patient today.  Concerns regarding medicines are outlined above.  No orders of the defined types were placed in this encounter.  No orders of the defined types were placed in this encounter.   Signed, Fransico Him, MD  11/28/2016 8:53 AM    [place]

## 2016-11-29 ENCOUNTER — Encounter: Payer: Self-pay | Admitting: Cardiology

## 2016-12-15 ENCOUNTER — Ambulatory Visit: Payer: Medicare HMO | Admitting: Cardiology

## 2016-12-15 DIAGNOSIS — E1151 Type 2 diabetes mellitus with diabetic peripheral angiopathy without gangrene: Secondary | ICD-10-CM | POA: Diagnosis not present

## 2016-12-15 DIAGNOSIS — N183 Chronic kidney disease, stage 3 (moderate): Secondary | ICD-10-CM | POA: Diagnosis not present

## 2016-12-15 DIAGNOSIS — Z23 Encounter for immunization: Secondary | ICD-10-CM | POA: Diagnosis not present

## 2016-12-15 DIAGNOSIS — Z794 Long term (current) use of insulin: Secondary | ICD-10-CM | POA: Diagnosis not present

## 2016-12-15 DIAGNOSIS — E785 Hyperlipidemia, unspecified: Secondary | ICD-10-CM | POA: Diagnosis not present

## 2016-12-15 DIAGNOSIS — I129 Hypertensive chronic kidney disease with stage 1 through stage 4 chronic kidney disease, or unspecified chronic kidney disease: Secondary | ICD-10-CM | POA: Diagnosis not present

## 2016-12-15 DIAGNOSIS — E1165 Type 2 diabetes mellitus with hyperglycemia: Secondary | ICD-10-CM | POA: Diagnosis not present

## 2016-12-20 DIAGNOSIS — E785 Hyperlipidemia, unspecified: Secondary | ICD-10-CM | POA: Diagnosis not present

## 2016-12-20 DIAGNOSIS — I509 Heart failure, unspecified: Secondary | ICD-10-CM | POA: Diagnosis not present

## 2016-12-20 DIAGNOSIS — E663 Overweight: Secondary | ICD-10-CM | POA: Diagnosis not present

## 2016-12-20 DIAGNOSIS — K5900 Constipation, unspecified: Secondary | ICD-10-CM | POA: Diagnosis not present

## 2016-12-20 DIAGNOSIS — I10 Essential (primary) hypertension: Secondary | ICD-10-CM | POA: Diagnosis not present

## 2016-12-20 DIAGNOSIS — M159 Polyosteoarthritis, unspecified: Secondary | ICD-10-CM | POA: Diagnosis not present

## 2016-12-20 DIAGNOSIS — Z0000 Encounter for general adult medical examination without abnormal findings: Secondary | ICD-10-CM | POA: Diagnosis not present

## 2016-12-20 DIAGNOSIS — E1142 Type 2 diabetes mellitus with diabetic polyneuropathy: Secondary | ICD-10-CM | POA: Diagnosis not present

## 2016-12-20 DIAGNOSIS — K08409 Partial loss of teeth, unspecified cause, unspecified class: Secondary | ICD-10-CM | POA: Diagnosis not present

## 2016-12-20 DIAGNOSIS — H409 Unspecified glaucoma: Secondary | ICD-10-CM | POA: Diagnosis not present

## 2017-03-09 ENCOUNTER — Other Ambulatory Visit: Payer: Self-pay | Admitting: Family Medicine

## 2017-03-09 DIAGNOSIS — R229 Localized swelling, mass and lump, unspecified: Secondary | ICD-10-CM | POA: Diagnosis not present

## 2017-03-09 DIAGNOSIS — IMO0002 Reserved for concepts with insufficient information to code with codable children: Secondary | ICD-10-CM

## 2017-03-10 ENCOUNTER — Ambulatory Visit
Admission: RE | Admit: 2017-03-10 | Discharge: 2017-03-10 | Disposition: A | Discharge status: Home / Self Care | Payer: Medicare HMO | Source: Ambulatory Visit | Attending: Family Medicine | Admitting: Family Medicine

## 2017-03-10 DIAGNOSIS — R229 Localized swelling, mass and lump, unspecified: Principal | ICD-10-CM

## 2017-03-10 DIAGNOSIS — IMO0002 Reserved for concepts with insufficient information to code with codable children: Secondary | ICD-10-CM

## 2017-03-10 DIAGNOSIS — M7989 Other specified soft tissue disorders: Secondary | ICD-10-CM | POA: Diagnosis not present

## 2017-04-10 DIAGNOSIS — H04123 Dry eye syndrome of bilateral lacrimal glands: Secondary | ICD-10-CM | POA: Diagnosis not present

## 2017-04-10 DIAGNOSIS — E119 Type 2 diabetes mellitus without complications: Secondary | ICD-10-CM | POA: Diagnosis not present

## 2017-04-10 DIAGNOSIS — H401133 Primary open-angle glaucoma, bilateral, severe stage: Secondary | ICD-10-CM | POA: Diagnosis not present

## 2017-04-10 DIAGNOSIS — Z961 Presence of intraocular lens: Secondary | ICD-10-CM | POA: Diagnosis not present

## 2017-05-15 DIAGNOSIS — E119 Type 2 diabetes mellitus without complications: Secondary | ICD-10-CM | POA: Diagnosis not present

## 2017-05-15 DIAGNOSIS — Z961 Presence of intraocular lens: Secondary | ICD-10-CM | POA: Diagnosis not present

## 2017-05-15 DIAGNOSIS — H401133 Primary open-angle glaucoma, bilateral, severe stage: Secondary | ICD-10-CM | POA: Diagnosis not present

## 2017-05-15 DIAGNOSIS — H04123 Dry eye syndrome of bilateral lacrimal glands: Secondary | ICD-10-CM | POA: Diagnosis not present

## 2017-06-30 DIAGNOSIS — I130 Hypertensive heart and chronic kidney disease with heart failure and stage 1 through stage 4 chronic kidney disease, or unspecified chronic kidney disease: Secondary | ICD-10-CM | POA: Diagnosis not present

## 2017-06-30 DIAGNOSIS — R799 Abnormal finding of blood chemistry, unspecified: Secondary | ICD-10-CM | POA: Diagnosis not present

## 2017-06-30 DIAGNOSIS — Z794 Long term (current) use of insulin: Secondary | ICD-10-CM | POA: Diagnosis not present

## 2017-06-30 DIAGNOSIS — I4891 Unspecified atrial fibrillation: Secondary | ICD-10-CM | POA: Diagnosis not present

## 2017-06-30 DIAGNOSIS — I517 Cardiomegaly: Secondary | ICD-10-CM | POA: Diagnosis not present

## 2017-06-30 DIAGNOSIS — I739 Peripheral vascular disease, unspecified: Secondary | ICD-10-CM | POA: Diagnosis not present

## 2017-06-30 DIAGNOSIS — Z880 Allergy status to penicillin: Secondary | ICD-10-CM | POA: Diagnosis not present

## 2017-06-30 DIAGNOSIS — R4182 Altered mental status, unspecified: Secondary | ICD-10-CM | POA: Diagnosis not present

## 2017-06-30 DIAGNOSIS — E669 Obesity, unspecified: Secondary | ICD-10-CM | POA: Diagnosis not present

## 2017-06-30 DIAGNOSIS — E1140 Type 2 diabetes mellitus with diabetic neuropathy, unspecified: Secondary | ICD-10-CM | POA: Diagnosis not present

## 2017-06-30 DIAGNOSIS — E1151 Type 2 diabetes mellitus with diabetic peripheral angiopathy without gangrene: Secondary | ICD-10-CM | POA: Diagnosis not present

## 2017-06-30 DIAGNOSIS — I10 Essential (primary) hypertension: Secondary | ICD-10-CM | POA: Diagnosis not present

## 2017-06-30 DIAGNOSIS — Z8673 Personal history of transient ischemic attack (TIA), and cerebral infarction without residual deficits: Secondary | ICD-10-CM | POA: Diagnosis not present

## 2017-06-30 DIAGNOSIS — I5023 Acute on chronic systolic (congestive) heart failure: Secondary | ICD-10-CM | POA: Diagnosis not present

## 2017-06-30 DIAGNOSIS — I5020 Unspecified systolic (congestive) heart failure: Secondary | ICD-10-CM | POA: Diagnosis not present

## 2017-06-30 DIAGNOSIS — R0602 Shortness of breath: Secondary | ICD-10-CM | POA: Diagnosis not present

## 2017-06-30 DIAGNOSIS — R000 Tachycardia, unspecified: Secondary | ICD-10-CM | POA: Diagnosis not present

## 2017-06-30 DIAGNOSIS — N183 Chronic kidney disease, stage 3 (moderate): Secondary | ICD-10-CM | POA: Diagnosis not present

## 2017-06-30 DIAGNOSIS — I5021 Acute systolic (congestive) heart failure: Secondary | ICD-10-CM | POA: Diagnosis not present

## 2017-06-30 DIAGNOSIS — J811 Chronic pulmonary edema: Secondary | ICD-10-CM | POA: Diagnosis not present

## 2017-06-30 DIAGNOSIS — Z0000 Encounter for general adult medical examination without abnormal findings: Secondary | ICD-10-CM | POA: Diagnosis not present

## 2017-06-30 DIAGNOSIS — R600 Localized edema: Secondary | ICD-10-CM | POA: Diagnosis not present

## 2017-06-30 DIAGNOSIS — N179 Acute kidney failure, unspecified: Secondary | ICD-10-CM | POA: Diagnosis not present

## 2017-06-30 DIAGNOSIS — I2510 Atherosclerotic heart disease of native coronary artery without angina pectoris: Secondary | ICD-10-CM | POA: Diagnosis not present

## 2017-06-30 DIAGNOSIS — I509 Heart failure, unspecified: Secondary | ICD-10-CM | POA: Diagnosis not present

## 2017-06-30 DIAGNOSIS — Z8546 Personal history of malignant neoplasm of prostate: Secondary | ICD-10-CM | POA: Diagnosis not present

## 2017-06-30 DIAGNOSIS — E1122 Type 2 diabetes mellitus with diabetic chronic kidney disease: Secondary | ICD-10-CM | POA: Diagnosis not present

## 2017-06-30 DIAGNOSIS — I69351 Hemiplegia and hemiparesis following cerebral infarction affecting right dominant side: Secondary | ICD-10-CM | POA: Diagnosis not present

## 2017-06-30 DIAGNOSIS — E1165 Type 2 diabetes mellitus with hyperglycemia: Secondary | ICD-10-CM | POA: Diagnosis not present

## 2017-06-30 DIAGNOSIS — I340 Nonrheumatic mitral (valve) insufficiency: Secondary | ICD-10-CM | POA: Diagnosis not present

## 2017-07-01 DIAGNOSIS — I739 Peripheral vascular disease, unspecified: Secondary | ICD-10-CM | POA: Diagnosis not present

## 2017-07-01 DIAGNOSIS — I5021 Acute systolic (congestive) heart failure: Secondary | ICD-10-CM | POA: Diagnosis not present

## 2017-07-01 DIAGNOSIS — I509 Heart failure, unspecified: Secondary | ICD-10-CM | POA: Diagnosis not present

## 2017-07-01 DIAGNOSIS — I10 Essential (primary) hypertension: Secondary | ICD-10-CM | POA: Diagnosis not present

## 2017-07-01 DIAGNOSIS — I2510 Atherosclerotic heart disease of native coronary artery without angina pectoris: Secondary | ICD-10-CM | POA: Diagnosis not present

## 2017-07-02 DIAGNOSIS — I5020 Unspecified systolic (congestive) heart failure: Secondary | ICD-10-CM | POA: Diagnosis not present

## 2017-07-02 DIAGNOSIS — I10 Essential (primary) hypertension: Secondary | ICD-10-CM | POA: Diagnosis not present

## 2017-07-02 DIAGNOSIS — R4182 Altered mental status, unspecified: Secondary | ICD-10-CM | POA: Diagnosis not present

## 2017-07-02 DIAGNOSIS — I739 Peripheral vascular disease, unspecified: Secondary | ICD-10-CM | POA: Diagnosis not present

## 2017-07-02 DIAGNOSIS — E1140 Type 2 diabetes mellitus with diabetic neuropathy, unspecified: Secondary | ICD-10-CM | POA: Diagnosis not present

## 2017-07-02 DIAGNOSIS — I4891 Unspecified atrial fibrillation: Secondary | ICD-10-CM | POA: Diagnosis not present

## 2017-07-02 DIAGNOSIS — I2510 Atherosclerotic heart disease of native coronary artery without angina pectoris: Secondary | ICD-10-CM | POA: Diagnosis not present

## 2017-07-02 DIAGNOSIS — E1165 Type 2 diabetes mellitus with hyperglycemia: Secondary | ICD-10-CM | POA: Diagnosis not present

## 2017-07-02 DIAGNOSIS — N183 Chronic kidney disease, stage 3 (moderate): Secondary | ICD-10-CM | POA: Diagnosis not present

## 2017-07-02 DIAGNOSIS — I509 Heart failure, unspecified: Secondary | ICD-10-CM | POA: Diagnosis not present

## 2017-07-02 DIAGNOSIS — I130 Hypertensive heart and chronic kidney disease with heart failure and stage 1 through stage 4 chronic kidney disease, or unspecified chronic kidney disease: Secondary | ICD-10-CM | POA: Diagnosis not present

## 2017-07-02 DIAGNOSIS — I5023 Acute on chronic systolic (congestive) heart failure: Secondary | ICD-10-CM | POA: Diagnosis not present

## 2017-07-02 DIAGNOSIS — E1151 Type 2 diabetes mellitus with diabetic peripheral angiopathy without gangrene: Secondary | ICD-10-CM | POA: Diagnosis not present

## 2017-07-02 DIAGNOSIS — I5021 Acute systolic (congestive) heart failure: Secondary | ICD-10-CM | POA: Diagnosis not present

## 2017-07-02 DIAGNOSIS — I69351 Hemiplegia and hemiparesis following cerebral infarction affecting right dominant side: Secondary | ICD-10-CM | POA: Diagnosis not present

## 2017-07-02 DIAGNOSIS — E1122 Type 2 diabetes mellitus with diabetic chronic kidney disease: Secondary | ICD-10-CM | POA: Diagnosis not present

## 2017-07-03 DIAGNOSIS — I2510 Atherosclerotic heart disease of native coronary artery without angina pectoris: Secondary | ICD-10-CM | POA: Diagnosis not present

## 2017-07-03 DIAGNOSIS — I5021 Acute systolic (congestive) heart failure: Secondary | ICD-10-CM | POA: Diagnosis not present

## 2017-07-03 DIAGNOSIS — I10 Essential (primary) hypertension: Secondary | ICD-10-CM | POA: Diagnosis not present

## 2017-07-03 DIAGNOSIS — N179 Acute kidney failure, unspecified: Secondary | ICD-10-CM | POA: Diagnosis not present

## 2017-07-03 DIAGNOSIS — R600 Localized edema: Secondary | ICD-10-CM | POA: Diagnosis not present

## 2017-07-03 DIAGNOSIS — I739 Peripheral vascular disease, unspecified: Secondary | ICD-10-CM | POA: Diagnosis not present

## 2017-07-03 DIAGNOSIS — I5020 Unspecified systolic (congestive) heart failure: Secondary | ICD-10-CM | POA: Diagnosis not present

## 2017-07-04 DIAGNOSIS — I5021 Acute systolic (congestive) heart failure: Secondary | ICD-10-CM | POA: Diagnosis not present

## 2017-07-04 DIAGNOSIS — I2510 Atherosclerotic heart disease of native coronary artery without angina pectoris: Secondary | ICD-10-CM | POA: Diagnosis not present

## 2017-07-04 DIAGNOSIS — N179 Acute kidney failure, unspecified: Secondary | ICD-10-CM | POA: Diagnosis not present

## 2017-07-04 DIAGNOSIS — I739 Peripheral vascular disease, unspecified: Secondary | ICD-10-CM | POA: Diagnosis not present

## 2017-07-04 DIAGNOSIS — I5020 Unspecified systolic (congestive) heart failure: Secondary | ICD-10-CM | POA: Diagnosis not present

## 2017-07-04 DIAGNOSIS — I10 Essential (primary) hypertension: Secondary | ICD-10-CM | POA: Diagnosis not present

## 2017-07-05 DIAGNOSIS — I5020 Unspecified systolic (congestive) heart failure: Secondary | ICD-10-CM | POA: Diagnosis not present

## 2017-07-05 DIAGNOSIS — N179 Acute kidney failure, unspecified: Secondary | ICD-10-CM | POA: Diagnosis not present

## 2017-07-05 DIAGNOSIS — I2510 Atherosclerotic heart disease of native coronary artery without angina pectoris: Secondary | ICD-10-CM | POA: Diagnosis not present

## 2017-07-05 DIAGNOSIS — I4891 Unspecified atrial fibrillation: Secondary | ICD-10-CM | POA: Diagnosis not present

## 2017-07-05 DIAGNOSIS — I739 Peripheral vascular disease, unspecified: Secondary | ICD-10-CM | POA: Diagnosis not present

## 2017-07-05 DIAGNOSIS — I10 Essential (primary) hypertension: Secondary | ICD-10-CM | POA: Diagnosis not present

## 2017-07-05 DIAGNOSIS — I5021 Acute systolic (congestive) heart failure: Secondary | ICD-10-CM | POA: Diagnosis not present

## 2017-07-06 DIAGNOSIS — I5021 Acute systolic (congestive) heart failure: Secondary | ICD-10-CM | POA: Diagnosis not present

## 2017-07-06 DIAGNOSIS — I739 Peripheral vascular disease, unspecified: Secondary | ICD-10-CM | POA: Diagnosis not present

## 2017-07-06 DIAGNOSIS — I10 Essential (primary) hypertension: Secondary | ICD-10-CM | POA: Diagnosis not present

## 2017-07-06 DIAGNOSIS — I5020 Unspecified systolic (congestive) heart failure: Secondary | ICD-10-CM | POA: Diagnosis not present

## 2017-07-06 DIAGNOSIS — I340 Nonrheumatic mitral (valve) insufficiency: Secondary | ICD-10-CM | POA: Diagnosis not present

## 2017-07-06 DIAGNOSIS — I4891 Unspecified atrial fibrillation: Secondary | ICD-10-CM | POA: Diagnosis not present

## 2017-07-06 DIAGNOSIS — I2510 Atherosclerotic heart disease of native coronary artery without angina pectoris: Secondary | ICD-10-CM | POA: Diagnosis not present

## 2017-07-07 DIAGNOSIS — Z952 Presence of prosthetic heart valve: Secondary | ICD-10-CM | POA: Diagnosis not present

## 2017-07-07 DIAGNOSIS — Z48813 Encounter for surgical aftercare following surgery on the respiratory system: Secondary | ICD-10-CM | POA: Diagnosis not present

## 2017-07-07 DIAGNOSIS — F0391 Unspecified dementia with behavioral disturbance: Secondary | ICD-10-CM | POA: Diagnosis not present

## 2017-07-07 DIAGNOSIS — I25810 Atherosclerosis of coronary artery bypass graft(s) without angina pectoris: Secondary | ICD-10-CM | POA: Diagnosis not present

## 2017-07-07 DIAGNOSIS — I5023 Acute on chronic systolic (congestive) heart failure: Secondary | ICD-10-CM | POA: Diagnosis not present

## 2017-07-07 DIAGNOSIS — R41841 Cognitive communication deficit: Secondary | ICD-10-CM | POA: Diagnosis not present

## 2017-07-07 DIAGNOSIS — F05 Delirium due to known physiological condition: Secondary | ICD-10-CM | POA: Diagnosis not present

## 2017-07-07 DIAGNOSIS — E785 Hyperlipidemia, unspecified: Secondary | ICD-10-CM | POA: Diagnosis not present

## 2017-07-07 DIAGNOSIS — Z951 Presence of aortocoronary bypass graft: Secondary | ICD-10-CM | POA: Diagnosis not present

## 2017-07-07 DIAGNOSIS — K2970 Gastritis, unspecified, without bleeding: Secondary | ICD-10-CM | POA: Diagnosis not present

## 2017-07-07 DIAGNOSIS — J9600 Acute respiratory failure, unspecified whether with hypoxia or hypercapnia: Secondary | ICD-10-CM | POA: Diagnosis not present

## 2017-07-07 DIAGNOSIS — R69 Illness, unspecified: Secondary | ICD-10-CM | POA: Diagnosis not present

## 2017-07-07 DIAGNOSIS — I517 Cardiomegaly: Secondary | ICD-10-CM | POA: Diagnosis not present

## 2017-07-07 DIAGNOSIS — I429 Cardiomyopathy, unspecified: Secondary | ICD-10-CM | POA: Diagnosis not present

## 2017-07-07 DIAGNOSIS — R001 Bradycardia, unspecified: Secondary | ICD-10-CM | POA: Diagnosis not present

## 2017-07-07 DIAGNOSIS — I10 Essential (primary) hypertension: Secondary | ICD-10-CM | POA: Diagnosis not present

## 2017-07-07 DIAGNOSIS — R1310 Dysphagia, unspecified: Secondary | ICD-10-CM | POA: Diagnosis not present

## 2017-07-07 DIAGNOSIS — R918 Other nonspecific abnormal finding of lung field: Secondary | ICD-10-CM | POA: Diagnosis not present

## 2017-07-07 DIAGNOSIS — C61 Malignant neoplasm of prostate: Secondary | ICD-10-CM | POA: Diagnosis not present

## 2017-07-07 DIAGNOSIS — I63231 Cerebral infarction due to unspecified occlusion or stenosis of right carotid arteries: Secondary | ICD-10-CM | POA: Diagnosis not present

## 2017-07-07 DIAGNOSIS — I050 Rheumatic mitral stenosis: Secondary | ICD-10-CM | POA: Diagnosis not present

## 2017-07-07 DIAGNOSIS — J9602 Acute respiratory failure with hypercapnia: Secondary | ICD-10-CM | POA: Diagnosis not present

## 2017-07-07 DIAGNOSIS — Z9911 Dependence on respirator [ventilator] status: Secondary | ICD-10-CM | POA: Diagnosis not present

## 2017-07-07 DIAGNOSIS — J986 Disorders of diaphragm: Secondary | ICD-10-CM | POA: Diagnosis not present

## 2017-07-07 DIAGNOSIS — T8119XA Other postprocedural shock, initial encounter: Secondary | ICD-10-CM | POA: Diagnosis not present

## 2017-07-07 DIAGNOSIS — Z452 Encounter for adjustment and management of vascular access device: Secondary | ICD-10-CM | POA: Diagnosis not present

## 2017-07-07 DIAGNOSIS — E1165 Type 2 diabetes mellitus with hyperglycemia: Secondary | ICD-10-CM | POA: Diagnosis not present

## 2017-07-07 DIAGNOSIS — R569 Unspecified convulsions: Secondary | ICD-10-CM | POA: Diagnosis not present

## 2017-07-07 DIAGNOSIS — I499 Cardiac arrhythmia, unspecified: Secondary | ICD-10-CM | POA: Diagnosis not present

## 2017-07-07 DIAGNOSIS — J9589 Other postprocedural complications and disorders of respiratory system, not elsewhere classified: Secondary | ICD-10-CM | POA: Diagnosis not present

## 2017-07-07 DIAGNOSIS — J209 Acute bronchitis, unspecified: Secondary | ICD-10-CM | POA: Diagnosis not present

## 2017-07-07 DIAGNOSIS — R1312 Dysphagia, oropharyngeal phase: Secondary | ICD-10-CM | POA: Diagnosis not present

## 2017-07-07 DIAGNOSIS — E870 Hyperosmolality and hypernatremia: Secondary | ICD-10-CM | POA: Diagnosis not present

## 2017-07-07 DIAGNOSIS — J9690 Respiratory failure, unspecified, unspecified whether with hypoxia or hypercapnia: Secondary | ICD-10-CM | POA: Diagnosis not present

## 2017-07-07 DIAGNOSIS — R4182 Altered mental status, unspecified: Secondary | ICD-10-CM | POA: Diagnosis not present

## 2017-07-07 DIAGNOSIS — I340 Nonrheumatic mitral (valve) insufficiency: Secondary | ICD-10-CM | POA: Diagnosis not present

## 2017-07-07 DIAGNOSIS — Z0131 Encounter for examination of blood pressure with abnormal findings: Secondary | ICD-10-CM | POA: Diagnosis not present

## 2017-07-07 DIAGNOSIS — R1084 Generalized abdominal pain: Secondary | ICD-10-CM | POA: Diagnosis not present

## 2017-07-07 DIAGNOSIS — E119 Type 2 diabetes mellitus without complications: Secondary | ICD-10-CM | POA: Diagnosis not present

## 2017-07-07 DIAGNOSIS — I459 Conduction disorder, unspecified: Secondary | ICD-10-CM | POA: Diagnosis not present

## 2017-07-07 DIAGNOSIS — J989 Respiratory disorder, unspecified: Secondary | ICD-10-CM | POA: Diagnosis not present

## 2017-07-07 DIAGNOSIS — I69351 Hemiplegia and hemiparesis following cerebral infarction affecting right dominant side: Secondary | ICD-10-CM | POA: Diagnosis not present

## 2017-07-07 DIAGNOSIS — Z8673 Personal history of transient ischemic attack (TIA), and cerebral infarction without residual deficits: Secondary | ICD-10-CM | POA: Diagnosis not present

## 2017-07-07 DIAGNOSIS — R571 Hypovolemic shock: Secondary | ICD-10-CM | POA: Diagnosis not present

## 2017-07-07 DIAGNOSIS — Z9989 Dependence on other enabling machines and devices: Secondary | ICD-10-CM | POA: Diagnosis not present

## 2017-07-07 DIAGNOSIS — D500 Iron deficiency anemia secondary to blood loss (chronic): Secondary | ICD-10-CM | POA: Diagnosis not present

## 2017-07-07 DIAGNOSIS — I350 Nonrheumatic aortic (valve) stenosis: Secondary | ICD-10-CM | POA: Diagnosis not present

## 2017-07-07 DIAGNOSIS — J811 Chronic pulmonary edema: Secondary | ICD-10-CM | POA: Diagnosis not present

## 2017-07-07 DIAGNOSIS — Z930 Tracheostomy status: Secondary | ICD-10-CM | POA: Diagnosis not present

## 2017-07-07 DIAGNOSIS — T17300A Unspecified foreign body in larynx causing asphyxiation, initial encounter: Secondary | ICD-10-CM | POA: Diagnosis not present

## 2017-07-07 DIAGNOSIS — E8770 Fluid overload, unspecified: Secondary | ICD-10-CM | POA: Diagnosis not present

## 2017-07-07 DIAGNOSIS — I5021 Acute systolic (congestive) heart failure: Secondary | ICD-10-CM | POA: Diagnosis not present

## 2017-07-07 DIAGNOSIS — I63232 Cerebral infarction due to unspecified occlusion or stenosis of left carotid arteries: Secondary | ICD-10-CM | POA: Diagnosis not present

## 2017-07-07 DIAGNOSIS — Z45018 Encounter for adjustment and management of other part of cardiac pacemaker: Secondary | ICD-10-CM | POA: Diagnosis not present

## 2017-07-07 DIAGNOSIS — J9601 Acute respiratory failure with hypoxia: Secondary | ICD-10-CM | POA: Diagnosis not present

## 2017-07-07 DIAGNOSIS — N183 Chronic kidney disease, stage 3 (moderate): Secondary | ICD-10-CM | POA: Diagnosis not present

## 2017-07-07 DIAGNOSIS — R5383 Other fatigue: Secondary | ICD-10-CM | POA: Diagnosis not present

## 2017-07-07 DIAGNOSIS — I130 Hypertensive heart and chronic kidney disease with heart failure and stage 1 through stage 4 chronic kidney disease, or unspecified chronic kidney disease: Secondary | ICD-10-CM | POA: Diagnosis not present

## 2017-07-07 DIAGNOSIS — Z23 Encounter for immunization: Secondary | ICD-10-CM | POA: Diagnosis not present

## 2017-07-07 DIAGNOSIS — I442 Atrioventricular block, complete: Secondary | ICD-10-CM | POA: Diagnosis not present

## 2017-07-07 DIAGNOSIS — R5381 Other malaise: Secondary | ICD-10-CM | POA: Diagnosis not present

## 2017-07-07 DIAGNOSIS — I4891 Unspecified atrial fibrillation: Secondary | ICD-10-CM | POA: Diagnosis not present

## 2017-07-07 DIAGNOSIS — J150 Pneumonia due to Klebsiella pneumoniae: Secondary | ICD-10-CM | POA: Diagnosis not present

## 2017-07-07 DIAGNOSIS — Z48812 Encounter for surgical aftercare following surgery on the circulatory system: Secondary | ICD-10-CM | POA: Diagnosis not present

## 2017-07-07 DIAGNOSIS — N390 Urinary tract infection, site not specified: Secondary | ICD-10-CM | POA: Diagnosis not present

## 2017-07-07 DIAGNOSIS — Z950 Presence of cardiac pacemaker: Secondary | ICD-10-CM | POA: Diagnosis not present

## 2017-07-07 DIAGNOSIS — N189 Chronic kidney disease, unspecified: Secondary | ICD-10-CM | POA: Diagnosis not present

## 2017-07-07 DIAGNOSIS — R4701 Aphasia: Secondary | ICD-10-CM | POA: Diagnosis not present

## 2017-07-07 DIAGNOSIS — J95821 Acute postprocedural respiratory failure: Secondary | ICD-10-CM | POA: Diagnosis not present

## 2017-07-07 DIAGNOSIS — Z4682 Encounter for fitting and adjustment of non-vascular catheter: Secondary | ICD-10-CM | POA: Diagnosis not present

## 2017-07-07 DIAGNOSIS — R0989 Other specified symptoms and signs involving the circulatory and respiratory systems: Secondary | ICD-10-CM | POA: Diagnosis not present

## 2017-07-07 DIAGNOSIS — J90 Pleural effusion, not elsewhere classified: Secondary | ICD-10-CM | POA: Diagnosis not present

## 2017-07-07 DIAGNOSIS — R1311 Dysphagia, oral phase: Secondary | ICD-10-CM | POA: Diagnosis not present

## 2017-07-07 DIAGNOSIS — I2510 Atherosclerotic heart disease of native coronary artery without angina pectoris: Secondary | ICD-10-CM | POA: Diagnosis not present

## 2017-07-07 DIAGNOSIS — Z136 Encounter for screening for cardiovascular disorders: Secondary | ICD-10-CM | POA: Diagnosis not present

## 2017-07-07 DIAGNOSIS — I342 Nonrheumatic mitral (valve) stenosis: Secondary | ICD-10-CM | POA: Diagnosis not present

## 2017-07-07 DIAGNOSIS — J9610 Chronic respiratory failure, unspecified whether with hypoxia or hypercapnia: Secondary | ICD-10-CM | POA: Diagnosis not present

## 2017-07-07 DIAGNOSIS — J9811 Atelectasis: Secondary | ICD-10-CM | POA: Diagnosis not present

## 2017-07-07 DIAGNOSIS — R269 Unspecified abnormalities of gait and mobility: Secondary | ICD-10-CM | POA: Diagnosis not present

## 2017-07-07 DIAGNOSIS — K922 Gastrointestinal hemorrhage, unspecified: Secondary | ICD-10-CM | POA: Diagnosis not present

## 2017-07-07 DIAGNOSIS — Z430 Encounter for attention to tracheostomy: Secondary | ICD-10-CM | POA: Diagnosis not present

## 2017-07-07 DIAGNOSIS — Z7409 Other reduced mobility: Secondary | ICD-10-CM | POA: Diagnosis not present

## 2017-07-07 DIAGNOSIS — N179 Acute kidney failure, unspecified: Secondary | ICD-10-CM | POA: Diagnosis not present

## 2017-07-07 DIAGNOSIS — I5020 Unspecified systolic (congestive) heart failure: Secondary | ICD-10-CM | POA: Diagnosis not present

## 2017-07-07 DIAGNOSIS — N400 Enlarged prostate without lower urinary tract symptoms: Secondary | ICD-10-CM | POA: Diagnosis not present

## 2017-07-07 DIAGNOSIS — I509 Heart failure, unspecified: Secondary | ICD-10-CM | POA: Diagnosis not present

## 2017-07-07 DIAGNOSIS — Z01810 Encounter for preprocedural cardiovascular examination: Secondary | ICD-10-CM | POA: Diagnosis not present

## 2017-07-07 DIAGNOSIS — E118 Type 2 diabetes mellitus with unspecified complications: Secondary | ICD-10-CM | POA: Diagnosis not present

## 2017-07-07 DIAGNOSIS — J439 Emphysema, unspecified: Secondary | ICD-10-CM | POA: Diagnosis not present

## 2017-07-07 DIAGNOSIS — I349 Nonrheumatic mitral valve disorder, unspecified: Secondary | ICD-10-CM | POA: Diagnosis not present

## 2017-07-07 DIAGNOSIS — J189 Pneumonia, unspecified organism: Secondary | ICD-10-CM | POA: Diagnosis not present

## 2017-07-07 DIAGNOSIS — R6889 Other general symptoms and signs: Secondary | ICD-10-CM | POA: Diagnosis not present

## 2017-07-07 DIAGNOSIS — E861 Hypovolemia: Secondary | ICD-10-CM | POA: Diagnosis not present

## 2017-07-07 DIAGNOSIS — I739 Peripheral vascular disease, unspecified: Secondary | ICD-10-CM | POA: Diagnosis not present

## 2017-07-07 DIAGNOSIS — M6281 Muscle weakness (generalized): Secondary | ICD-10-CM | POA: Diagnosis not present

## 2017-07-07 DIAGNOSIS — R109 Unspecified abdominal pain: Secondary | ICD-10-CM | POA: Diagnosis not present

## 2017-07-07 DIAGNOSIS — T8119XD Other postprocedural shock, subsequent encounter: Secondary | ICD-10-CM | POA: Diagnosis not present

## 2017-07-07 DIAGNOSIS — Z954 Presence of other heart-valve replacement: Secondary | ICD-10-CM | POA: Diagnosis not present

## 2017-07-08 DIAGNOSIS — I4891 Unspecified atrial fibrillation: Secondary | ICD-10-CM | POA: Diagnosis not present

## 2017-07-08 DIAGNOSIS — I5020 Unspecified systolic (congestive) heart failure: Secondary | ICD-10-CM | POA: Diagnosis not present

## 2017-07-08 DIAGNOSIS — I340 Nonrheumatic mitral (valve) insufficiency: Secondary | ICD-10-CM | POA: Diagnosis not present

## 2017-07-08 DIAGNOSIS — N179 Acute kidney failure, unspecified: Secondary | ICD-10-CM | POA: Diagnosis not present

## 2017-07-09 DIAGNOSIS — I63231 Cerebral infarction due to unspecified occlusion or stenosis of right carotid arteries: Secondary | ICD-10-CM | POA: Diagnosis not present

## 2017-07-09 DIAGNOSIS — Z01810 Encounter for preprocedural cardiovascular examination: Secondary | ICD-10-CM | POA: Diagnosis not present

## 2017-07-09 DIAGNOSIS — N179 Acute kidney failure, unspecified: Secondary | ICD-10-CM | POA: Diagnosis not present

## 2017-07-09 DIAGNOSIS — I340 Nonrheumatic mitral (valve) insufficiency: Secondary | ICD-10-CM | POA: Diagnosis not present

## 2017-07-09 DIAGNOSIS — I5020 Unspecified systolic (congestive) heart failure: Secondary | ICD-10-CM | POA: Diagnosis not present

## 2017-07-09 DIAGNOSIS — I10 Essential (primary) hypertension: Secondary | ICD-10-CM | POA: Diagnosis not present

## 2017-07-09 DIAGNOSIS — I5021 Acute systolic (congestive) heart failure: Secondary | ICD-10-CM | POA: Diagnosis not present

## 2017-07-09 DIAGNOSIS — I739 Peripheral vascular disease, unspecified: Secondary | ICD-10-CM | POA: Diagnosis not present

## 2017-07-09 DIAGNOSIS — I63232 Cerebral infarction due to unspecified occlusion or stenosis of left carotid arteries: Secondary | ICD-10-CM | POA: Diagnosis not present

## 2017-07-09 DIAGNOSIS — I4891 Unspecified atrial fibrillation: Secondary | ICD-10-CM | POA: Diagnosis not present

## 2017-07-09 DIAGNOSIS — I2510 Atherosclerotic heart disease of native coronary artery without angina pectoris: Secondary | ICD-10-CM | POA: Diagnosis not present

## 2017-07-09 DIAGNOSIS — R69 Illness, unspecified: Secondary | ICD-10-CM | POA: Diagnosis not present

## 2017-07-10 DIAGNOSIS — I4891 Unspecified atrial fibrillation: Secondary | ICD-10-CM | POA: Diagnosis not present

## 2017-07-10 DIAGNOSIS — R6889 Other general symptoms and signs: Secondary | ICD-10-CM | POA: Diagnosis not present

## 2017-07-10 DIAGNOSIS — I10 Essential (primary) hypertension: Secondary | ICD-10-CM | POA: Diagnosis not present

## 2017-07-10 DIAGNOSIS — I5020 Unspecified systolic (congestive) heart failure: Secondary | ICD-10-CM | POA: Diagnosis not present

## 2017-07-10 DIAGNOSIS — R69 Illness, unspecified: Secondary | ICD-10-CM | POA: Diagnosis not present

## 2017-07-10 DIAGNOSIS — I739 Peripheral vascular disease, unspecified: Secondary | ICD-10-CM | POA: Diagnosis not present

## 2017-07-10 DIAGNOSIS — N179 Acute kidney failure, unspecified: Secondary | ICD-10-CM | POA: Diagnosis not present

## 2017-07-10 DIAGNOSIS — I340 Nonrheumatic mitral (valve) insufficiency: Secondary | ICD-10-CM | POA: Diagnosis not present

## 2017-07-10 DIAGNOSIS — E119 Type 2 diabetes mellitus without complications: Secondary | ICD-10-CM | POA: Diagnosis not present

## 2017-07-10 DIAGNOSIS — N189 Chronic kidney disease, unspecified: Secondary | ICD-10-CM | POA: Diagnosis not present

## 2017-07-10 DIAGNOSIS — Z8673 Personal history of transient ischemic attack (TIA), and cerebral infarction without residual deficits: Secondary | ICD-10-CM | POA: Diagnosis not present

## 2017-07-10 DIAGNOSIS — I2510 Atherosclerotic heart disease of native coronary artery without angina pectoris: Secondary | ICD-10-CM | POA: Diagnosis not present

## 2017-07-10 DIAGNOSIS — I5021 Acute systolic (congestive) heart failure: Secondary | ICD-10-CM | POA: Diagnosis not present

## 2017-07-11 DIAGNOSIS — I340 Nonrheumatic mitral (valve) insufficiency: Secondary | ICD-10-CM | POA: Diagnosis not present

## 2017-07-11 DIAGNOSIS — I4891 Unspecified atrial fibrillation: Secondary | ICD-10-CM | POA: Diagnosis not present

## 2017-07-11 DIAGNOSIS — I10 Essential (primary) hypertension: Secondary | ICD-10-CM | POA: Diagnosis not present

## 2017-07-11 DIAGNOSIS — N179 Acute kidney failure, unspecified: Secondary | ICD-10-CM | POA: Diagnosis not present

## 2017-07-11 DIAGNOSIS — I2510 Atherosclerotic heart disease of native coronary artery without angina pectoris: Secondary | ICD-10-CM | POA: Diagnosis not present

## 2017-07-11 DIAGNOSIS — I5020 Unspecified systolic (congestive) heart failure: Secondary | ICD-10-CM | POA: Diagnosis not present

## 2017-07-11 DIAGNOSIS — I5021 Acute systolic (congestive) heart failure: Secondary | ICD-10-CM | POA: Diagnosis not present

## 2017-07-11 DIAGNOSIS — I739 Peripheral vascular disease, unspecified: Secondary | ICD-10-CM | POA: Diagnosis not present

## 2017-07-11 DIAGNOSIS — R69 Illness, unspecified: Secondary | ICD-10-CM | POA: Diagnosis not present

## 2017-07-12 DIAGNOSIS — I739 Peripheral vascular disease, unspecified: Secondary | ICD-10-CM | POA: Diagnosis not present

## 2017-07-12 DIAGNOSIS — N179 Acute kidney failure, unspecified: Secondary | ICD-10-CM | POA: Diagnosis not present

## 2017-07-12 DIAGNOSIS — I10 Essential (primary) hypertension: Secondary | ICD-10-CM | POA: Diagnosis not present

## 2017-07-12 DIAGNOSIS — I5020 Unspecified systolic (congestive) heart failure: Secondary | ICD-10-CM | POA: Diagnosis not present

## 2017-07-12 DIAGNOSIS — I5021 Acute systolic (congestive) heart failure: Secondary | ICD-10-CM | POA: Diagnosis not present

## 2017-07-12 DIAGNOSIS — I4891 Unspecified atrial fibrillation: Secondary | ICD-10-CM | POA: Diagnosis not present

## 2017-07-12 DIAGNOSIS — I340 Nonrheumatic mitral (valve) insufficiency: Secondary | ICD-10-CM | POA: Diagnosis not present

## 2017-07-12 DIAGNOSIS — I2510 Atherosclerotic heart disease of native coronary artery without angina pectoris: Secondary | ICD-10-CM | POA: Diagnosis not present

## 2017-07-13 DIAGNOSIS — I5020 Unspecified systolic (congestive) heart failure: Secondary | ICD-10-CM | POA: Diagnosis not present

## 2017-07-13 DIAGNOSIS — I5021 Acute systolic (congestive) heart failure: Secondary | ICD-10-CM | POA: Diagnosis not present

## 2017-07-13 DIAGNOSIS — I342 Nonrheumatic mitral (valve) stenosis: Secondary | ICD-10-CM | POA: Diagnosis not present

## 2017-07-13 DIAGNOSIS — I340 Nonrheumatic mitral (valve) insufficiency: Secondary | ICD-10-CM | POA: Diagnosis not present

## 2017-07-13 DIAGNOSIS — N179 Acute kidney failure, unspecified: Secondary | ICD-10-CM | POA: Diagnosis not present

## 2017-07-13 DIAGNOSIS — I2510 Atherosclerotic heart disease of native coronary artery without angina pectoris: Secondary | ICD-10-CM | POA: Diagnosis not present

## 2017-07-13 DIAGNOSIS — I10 Essential (primary) hypertension: Secondary | ICD-10-CM | POA: Diagnosis not present

## 2017-07-13 DIAGNOSIS — I739 Peripheral vascular disease, unspecified: Secondary | ICD-10-CM | POA: Diagnosis not present

## 2017-07-13 DIAGNOSIS — E785 Hyperlipidemia, unspecified: Secondary | ICD-10-CM | POA: Diagnosis not present

## 2017-07-13 DIAGNOSIS — I4891 Unspecified atrial fibrillation: Secondary | ICD-10-CM | POA: Diagnosis not present

## 2017-07-14 DIAGNOSIS — I350 Nonrheumatic aortic (valve) stenosis: Secondary | ICD-10-CM | POA: Diagnosis not present

## 2017-07-14 DIAGNOSIS — I739 Peripheral vascular disease, unspecified: Secondary | ICD-10-CM | POA: Diagnosis not present

## 2017-07-14 DIAGNOSIS — Z8673 Personal history of transient ischemic attack (TIA), and cerebral infarction without residual deficits: Secondary | ICD-10-CM | POA: Diagnosis not present

## 2017-07-14 DIAGNOSIS — N189 Chronic kidney disease, unspecified: Secondary | ICD-10-CM | POA: Diagnosis not present

## 2017-07-14 DIAGNOSIS — N179 Acute kidney failure, unspecified: Secondary | ICD-10-CM | POA: Diagnosis not present

## 2017-07-14 DIAGNOSIS — I2510 Atherosclerotic heart disease of native coronary artery without angina pectoris: Secondary | ICD-10-CM | POA: Diagnosis not present

## 2017-07-14 DIAGNOSIS — I340 Nonrheumatic mitral (valve) insufficiency: Secondary | ICD-10-CM | POA: Diagnosis not present

## 2017-07-14 DIAGNOSIS — I10 Essential (primary) hypertension: Secondary | ICD-10-CM | POA: Diagnosis not present

## 2017-07-14 DIAGNOSIS — I5021 Acute systolic (congestive) heart failure: Secondary | ICD-10-CM | POA: Diagnosis not present

## 2017-07-14 DIAGNOSIS — E119 Type 2 diabetes mellitus without complications: Secondary | ICD-10-CM | POA: Diagnosis not present

## 2017-07-14 DIAGNOSIS — I5020 Unspecified systolic (congestive) heart failure: Secondary | ICD-10-CM | POA: Diagnosis not present

## 2017-07-14 DIAGNOSIS — I4891 Unspecified atrial fibrillation: Secondary | ICD-10-CM | POA: Diagnosis not present

## 2017-07-15 DIAGNOSIS — I10 Essential (primary) hypertension: Secondary | ICD-10-CM | POA: Diagnosis not present

## 2017-07-15 DIAGNOSIS — I340 Nonrheumatic mitral (valve) insufficiency: Secondary | ICD-10-CM | POA: Diagnosis not present

## 2017-07-15 DIAGNOSIS — I739 Peripheral vascular disease, unspecified: Secondary | ICD-10-CM | POA: Diagnosis not present

## 2017-07-15 DIAGNOSIS — N179 Acute kidney failure, unspecified: Secondary | ICD-10-CM | POA: Diagnosis not present

## 2017-07-15 DIAGNOSIS — I2510 Atherosclerotic heart disease of native coronary artery without angina pectoris: Secondary | ICD-10-CM | POA: Diagnosis not present

## 2017-07-15 DIAGNOSIS — E119 Type 2 diabetes mellitus without complications: Secondary | ICD-10-CM | POA: Diagnosis not present

## 2017-07-15 DIAGNOSIS — I5020 Unspecified systolic (congestive) heart failure: Secondary | ICD-10-CM | POA: Diagnosis not present

## 2017-07-15 DIAGNOSIS — N189 Chronic kidney disease, unspecified: Secondary | ICD-10-CM | POA: Diagnosis not present

## 2017-07-15 DIAGNOSIS — I5021 Acute systolic (congestive) heart failure: Secondary | ICD-10-CM | POA: Diagnosis not present

## 2017-07-15 DIAGNOSIS — I4891 Unspecified atrial fibrillation: Secondary | ICD-10-CM | POA: Diagnosis not present

## 2017-07-15 DIAGNOSIS — Z8673 Personal history of transient ischemic attack (TIA), and cerebral infarction without residual deficits: Secondary | ICD-10-CM | POA: Diagnosis not present

## 2017-07-15 DIAGNOSIS — I350 Nonrheumatic aortic (valve) stenosis: Secondary | ICD-10-CM | POA: Diagnosis not present

## 2017-07-16 DIAGNOSIS — I4891 Unspecified atrial fibrillation: Secondary | ICD-10-CM | POA: Diagnosis not present

## 2017-07-16 DIAGNOSIS — I10 Essential (primary) hypertension: Secondary | ICD-10-CM | POA: Diagnosis not present

## 2017-07-16 DIAGNOSIS — I5021 Acute systolic (congestive) heart failure: Secondary | ICD-10-CM | POA: Diagnosis not present

## 2017-07-16 DIAGNOSIS — I2510 Atherosclerotic heart disease of native coronary artery without angina pectoris: Secondary | ICD-10-CM | POA: Diagnosis not present

## 2017-07-16 DIAGNOSIS — I739 Peripheral vascular disease, unspecified: Secondary | ICD-10-CM | POA: Diagnosis not present

## 2017-07-16 DIAGNOSIS — I5020 Unspecified systolic (congestive) heart failure: Secondary | ICD-10-CM | POA: Diagnosis not present

## 2017-07-16 DIAGNOSIS — N179 Acute kidney failure, unspecified: Secondary | ICD-10-CM | POA: Diagnosis not present

## 2017-07-17 DIAGNOSIS — N183 Chronic kidney disease, stage 3 (moderate): Secondary | ICD-10-CM | POA: Diagnosis not present

## 2017-07-17 DIAGNOSIS — I5020 Unspecified systolic (congestive) heart failure: Secondary | ICD-10-CM | POA: Diagnosis not present

## 2017-07-17 DIAGNOSIS — Z951 Presence of aortocoronary bypass graft: Secondary | ICD-10-CM | POA: Diagnosis not present

## 2017-07-17 DIAGNOSIS — T8119XD Other postprocedural shock, subsequent encounter: Secondary | ICD-10-CM | POA: Diagnosis not present

## 2017-07-17 DIAGNOSIS — I5023 Acute on chronic systolic (congestive) heart failure: Secondary | ICD-10-CM | POA: Diagnosis not present

## 2017-07-17 DIAGNOSIS — N179 Acute kidney failure, unspecified: Secondary | ICD-10-CM | POA: Diagnosis not present

## 2017-07-17 DIAGNOSIS — E1165 Type 2 diabetes mellitus with hyperglycemia: Secondary | ICD-10-CM | POA: Diagnosis not present

## 2017-07-17 DIAGNOSIS — I499 Cardiac arrhythmia, unspecified: Secondary | ICD-10-CM | POA: Diagnosis not present

## 2017-07-17 DIAGNOSIS — I2510 Atherosclerotic heart disease of native coronary artery without angina pectoris: Secondary | ICD-10-CM | POA: Diagnosis not present

## 2017-07-17 DIAGNOSIS — R001 Bradycardia, unspecified: Secondary | ICD-10-CM | POA: Diagnosis not present

## 2017-07-17 DIAGNOSIS — I340 Nonrheumatic mitral (valve) insufficiency: Secondary | ICD-10-CM | POA: Diagnosis not present

## 2017-07-17 DIAGNOSIS — T8119XA Other postprocedural shock, initial encounter: Secondary | ICD-10-CM | POA: Diagnosis not present

## 2017-07-17 DIAGNOSIS — Z0131 Encounter for examination of blood pressure with abnormal findings: Secondary | ICD-10-CM | POA: Diagnosis not present

## 2017-07-18 DIAGNOSIS — E861 Hypovolemia: Secondary | ICD-10-CM | POA: Diagnosis not present

## 2017-07-18 DIAGNOSIS — I739 Peripheral vascular disease, unspecified: Secondary | ICD-10-CM | POA: Diagnosis not present

## 2017-07-18 DIAGNOSIS — I4891 Unspecified atrial fibrillation: Secondary | ICD-10-CM | POA: Diagnosis not present

## 2017-07-18 DIAGNOSIS — J9589 Other postprocedural complications and disorders of respiratory system, not elsewhere classified: Secondary | ICD-10-CM | POA: Diagnosis not present

## 2017-07-18 DIAGNOSIS — T8119XD Other postprocedural shock, subsequent encounter: Secondary | ICD-10-CM | POA: Diagnosis not present

## 2017-07-18 DIAGNOSIS — N183 Chronic kidney disease, stage 3 (moderate): Secondary | ICD-10-CM | POA: Diagnosis not present

## 2017-07-18 DIAGNOSIS — I2510 Atherosclerotic heart disease of native coronary artery without angina pectoris: Secondary | ICD-10-CM | POA: Diagnosis not present

## 2017-07-18 DIAGNOSIS — R001 Bradycardia, unspecified: Secondary | ICD-10-CM | POA: Diagnosis not present

## 2017-07-18 DIAGNOSIS — J90 Pleural effusion, not elsewhere classified: Secondary | ICD-10-CM | POA: Diagnosis not present

## 2017-07-18 DIAGNOSIS — I5023 Acute on chronic systolic (congestive) heart failure: Secondary | ICD-10-CM | POA: Diagnosis not present

## 2017-07-18 DIAGNOSIS — N179 Acute kidney failure, unspecified: Secondary | ICD-10-CM | POA: Diagnosis not present

## 2017-07-18 DIAGNOSIS — I10 Essential (primary) hypertension: Secondary | ICD-10-CM | POA: Diagnosis not present

## 2017-07-18 DIAGNOSIS — Z136 Encounter for screening for cardiovascular disorders: Secondary | ICD-10-CM | POA: Diagnosis not present

## 2017-07-18 DIAGNOSIS — I5020 Unspecified systolic (congestive) heart failure: Secondary | ICD-10-CM | POA: Diagnosis not present

## 2017-07-18 DIAGNOSIS — Z48812 Encounter for surgical aftercare following surgery on the circulatory system: Secondary | ICD-10-CM | POA: Diagnosis not present

## 2017-07-18 DIAGNOSIS — E1165 Type 2 diabetes mellitus with hyperglycemia: Secondary | ICD-10-CM | POA: Diagnosis not present

## 2017-07-18 DIAGNOSIS — I5021 Acute systolic (congestive) heart failure: Secondary | ICD-10-CM | POA: Diagnosis not present

## 2017-07-18 DIAGNOSIS — T8119XA Other postprocedural shock, initial encounter: Secondary | ICD-10-CM | POA: Diagnosis not present

## 2017-07-19 DIAGNOSIS — J90 Pleural effusion, not elsewhere classified: Secondary | ICD-10-CM | POA: Diagnosis not present

## 2017-07-19 DIAGNOSIS — I2510 Atherosclerotic heart disease of native coronary artery without angina pectoris: Secondary | ICD-10-CM | POA: Diagnosis not present

## 2017-07-19 DIAGNOSIS — I10 Essential (primary) hypertension: Secondary | ICD-10-CM | POA: Diagnosis not present

## 2017-07-19 DIAGNOSIS — N179 Acute kidney failure, unspecified: Secondary | ICD-10-CM | POA: Diagnosis not present

## 2017-07-19 DIAGNOSIS — Z136 Encounter for screening for cardiovascular disorders: Secondary | ICD-10-CM | POA: Diagnosis not present

## 2017-07-19 DIAGNOSIS — Z45018 Encounter for adjustment and management of other part of cardiac pacemaker: Secondary | ICD-10-CM | POA: Diagnosis not present

## 2017-07-19 DIAGNOSIS — I5020 Unspecified systolic (congestive) heart failure: Secondary | ICD-10-CM | POA: Diagnosis not present

## 2017-07-19 DIAGNOSIS — I5021 Acute systolic (congestive) heart failure: Secondary | ICD-10-CM | POA: Diagnosis not present

## 2017-07-19 DIAGNOSIS — Z48812 Encounter for surgical aftercare following surgery on the circulatory system: Secondary | ICD-10-CM | POA: Diagnosis not present

## 2017-07-19 DIAGNOSIS — I442 Atrioventricular block, complete: Secondary | ICD-10-CM | POA: Diagnosis not present

## 2017-07-19 DIAGNOSIS — I739 Peripheral vascular disease, unspecified: Secondary | ICD-10-CM | POA: Diagnosis not present

## 2017-07-19 DIAGNOSIS — I4891 Unspecified atrial fibrillation: Secondary | ICD-10-CM | POA: Diagnosis not present

## 2017-07-20 DIAGNOSIS — J9601 Acute respiratory failure with hypoxia: Secondary | ICD-10-CM | POA: Diagnosis not present

## 2017-07-20 DIAGNOSIS — I5020 Unspecified systolic (congestive) heart failure: Secondary | ICD-10-CM | POA: Diagnosis not present

## 2017-07-20 DIAGNOSIS — I429 Cardiomyopathy, unspecified: Secondary | ICD-10-CM | POA: Diagnosis not present

## 2017-07-20 DIAGNOSIS — I5021 Acute systolic (congestive) heart failure: Secondary | ICD-10-CM | POA: Diagnosis not present

## 2017-07-20 DIAGNOSIS — N179 Acute kidney failure, unspecified: Secondary | ICD-10-CM | POA: Diagnosis not present

## 2017-07-20 DIAGNOSIS — I739 Peripheral vascular disease, unspecified: Secondary | ICD-10-CM | POA: Diagnosis not present

## 2017-07-20 DIAGNOSIS — I509 Heart failure, unspecified: Secondary | ICD-10-CM | POA: Diagnosis not present

## 2017-07-20 DIAGNOSIS — I4891 Unspecified atrial fibrillation: Secondary | ICD-10-CM | POA: Diagnosis not present

## 2017-07-20 DIAGNOSIS — I2510 Atherosclerotic heart disease of native coronary artery without angina pectoris: Secondary | ICD-10-CM | POA: Diagnosis not present

## 2017-07-20 DIAGNOSIS — I342 Nonrheumatic mitral (valve) stenosis: Secondary | ICD-10-CM | POA: Diagnosis not present

## 2017-07-20 DIAGNOSIS — Z136 Encounter for screening for cardiovascular disorders: Secondary | ICD-10-CM | POA: Diagnosis not present

## 2017-07-20 DIAGNOSIS — R0989 Other specified symptoms and signs involving the circulatory and respiratory systems: Secondary | ICD-10-CM | POA: Diagnosis not present

## 2017-07-20 DIAGNOSIS — Z45018 Encounter for adjustment and management of other part of cardiac pacemaker: Secondary | ICD-10-CM | POA: Diagnosis not present

## 2017-07-20 DIAGNOSIS — Z48812 Encounter for surgical aftercare following surgery on the circulatory system: Secondary | ICD-10-CM | POA: Diagnosis not present

## 2017-07-20 DIAGNOSIS — I442 Atrioventricular block, complete: Secondary | ICD-10-CM | POA: Diagnosis not present

## 2017-07-20 DIAGNOSIS — Z9989 Dependence on other enabling machines and devices: Secondary | ICD-10-CM | POA: Diagnosis not present

## 2017-07-20 DIAGNOSIS — I10 Essential (primary) hypertension: Secondary | ICD-10-CM | POA: Diagnosis not present

## 2017-07-20 DIAGNOSIS — I5023 Acute on chronic systolic (congestive) heart failure: Secondary | ICD-10-CM | POA: Diagnosis not present

## 2017-07-21 DIAGNOSIS — Z45018 Encounter for adjustment and management of other part of cardiac pacemaker: Secondary | ICD-10-CM | POA: Diagnosis not present

## 2017-07-21 DIAGNOSIS — I10 Essential (primary) hypertension: Secondary | ICD-10-CM | POA: Diagnosis not present

## 2017-07-21 DIAGNOSIS — Z4682 Encounter for fitting and adjustment of non-vascular catheter: Secondary | ICD-10-CM | POA: Diagnosis not present

## 2017-07-21 DIAGNOSIS — J9811 Atelectasis: Secondary | ICD-10-CM | POA: Diagnosis not present

## 2017-07-21 DIAGNOSIS — J9601 Acute respiratory failure with hypoxia: Secondary | ICD-10-CM | POA: Diagnosis not present

## 2017-07-21 DIAGNOSIS — N179 Acute kidney failure, unspecified: Secondary | ICD-10-CM | POA: Diagnosis not present

## 2017-07-21 DIAGNOSIS — R69 Illness, unspecified: Secondary | ICD-10-CM | POA: Diagnosis not present

## 2017-07-21 DIAGNOSIS — R0989 Other specified symptoms and signs involving the circulatory and respiratory systems: Secondary | ICD-10-CM | POA: Diagnosis not present

## 2017-07-21 DIAGNOSIS — I517 Cardiomegaly: Secondary | ICD-10-CM | POA: Diagnosis not present

## 2017-07-21 DIAGNOSIS — I739 Peripheral vascular disease, unspecified: Secondary | ICD-10-CM | POA: Diagnosis not present

## 2017-07-21 DIAGNOSIS — I5021 Acute systolic (congestive) heart failure: Secondary | ICD-10-CM | POA: Diagnosis not present

## 2017-07-21 DIAGNOSIS — I5023 Acute on chronic systolic (congestive) heart failure: Secondary | ICD-10-CM | POA: Diagnosis not present

## 2017-07-21 DIAGNOSIS — I442 Atrioventricular block, complete: Secondary | ICD-10-CM | POA: Diagnosis not present

## 2017-07-21 DIAGNOSIS — I5020 Unspecified systolic (congestive) heart failure: Secondary | ICD-10-CM | POA: Diagnosis not present

## 2017-07-21 DIAGNOSIS — Z48812 Encounter for surgical aftercare following surgery on the circulatory system: Secondary | ICD-10-CM | POA: Diagnosis not present

## 2017-07-21 DIAGNOSIS — J90 Pleural effusion, not elsewhere classified: Secondary | ICD-10-CM | POA: Diagnosis not present

## 2017-07-21 DIAGNOSIS — I2510 Atherosclerotic heart disease of native coronary artery without angina pectoris: Secondary | ICD-10-CM | POA: Diagnosis not present

## 2017-07-22 DIAGNOSIS — I739 Peripheral vascular disease, unspecified: Secondary | ICD-10-CM | POA: Diagnosis not present

## 2017-07-22 DIAGNOSIS — J9811 Atelectasis: Secondary | ICD-10-CM | POA: Diagnosis not present

## 2017-07-22 DIAGNOSIS — I517 Cardiomegaly: Secondary | ICD-10-CM | POA: Diagnosis not present

## 2017-07-22 DIAGNOSIS — I5020 Unspecified systolic (congestive) heart failure: Secondary | ICD-10-CM | POA: Diagnosis not present

## 2017-07-22 DIAGNOSIS — I5021 Acute systolic (congestive) heart failure: Secondary | ICD-10-CM | POA: Diagnosis not present

## 2017-07-22 DIAGNOSIS — Z45018 Encounter for adjustment and management of other part of cardiac pacemaker: Secondary | ICD-10-CM | POA: Diagnosis not present

## 2017-07-22 DIAGNOSIS — R69 Illness, unspecified: Secondary | ICD-10-CM | POA: Diagnosis not present

## 2017-07-22 DIAGNOSIS — Z4682 Encounter for fitting and adjustment of non-vascular catheter: Secondary | ICD-10-CM | POA: Diagnosis not present

## 2017-07-22 DIAGNOSIS — I442 Atrioventricular block, complete: Secondary | ICD-10-CM | POA: Diagnosis not present

## 2017-07-22 DIAGNOSIS — I10 Essential (primary) hypertension: Secondary | ICD-10-CM | POA: Diagnosis not present

## 2017-07-22 DIAGNOSIS — J9601 Acute respiratory failure with hypoxia: Secondary | ICD-10-CM | POA: Diagnosis not present

## 2017-07-22 DIAGNOSIS — J90 Pleural effusion, not elsewhere classified: Secondary | ICD-10-CM | POA: Diagnosis not present

## 2017-07-22 DIAGNOSIS — I5023 Acute on chronic systolic (congestive) heart failure: Secondary | ICD-10-CM | POA: Diagnosis not present

## 2017-07-22 DIAGNOSIS — R0989 Other specified symptoms and signs involving the circulatory and respiratory systems: Secondary | ICD-10-CM | POA: Diagnosis not present

## 2017-07-22 DIAGNOSIS — N179 Acute kidney failure, unspecified: Secondary | ICD-10-CM | POA: Diagnosis not present

## 2017-07-22 DIAGNOSIS — I2510 Atherosclerotic heart disease of native coronary artery without angina pectoris: Secondary | ICD-10-CM | POA: Diagnosis not present

## 2017-07-23 DIAGNOSIS — N179 Acute kidney failure, unspecified: Secondary | ICD-10-CM | POA: Diagnosis not present

## 2017-07-23 DIAGNOSIS — I10 Essential (primary) hypertension: Secondary | ICD-10-CM | POA: Diagnosis not present

## 2017-07-23 DIAGNOSIS — R5383 Other fatigue: Secondary | ICD-10-CM | POA: Diagnosis not present

## 2017-07-23 DIAGNOSIS — I342 Nonrheumatic mitral (valve) stenosis: Secondary | ICD-10-CM | POA: Diagnosis not present

## 2017-07-23 DIAGNOSIS — I5020 Unspecified systolic (congestive) heart failure: Secondary | ICD-10-CM | POA: Diagnosis not present

## 2017-07-23 DIAGNOSIS — J90 Pleural effusion, not elsewhere classified: Secondary | ICD-10-CM | POA: Diagnosis not present

## 2017-07-23 DIAGNOSIS — I517 Cardiomegaly: Secondary | ICD-10-CM | POA: Diagnosis not present

## 2017-07-23 DIAGNOSIS — I2510 Atherosclerotic heart disease of native coronary artery without angina pectoris: Secondary | ICD-10-CM | POA: Diagnosis not present

## 2017-07-23 DIAGNOSIS — J9811 Atelectasis: Secondary | ICD-10-CM | POA: Diagnosis not present

## 2017-07-23 DIAGNOSIS — J439 Emphysema, unspecified: Secondary | ICD-10-CM | POA: Diagnosis not present

## 2017-07-23 DIAGNOSIS — I5021 Acute systolic (congestive) heart failure: Secondary | ICD-10-CM | POA: Diagnosis not present

## 2017-07-23 DIAGNOSIS — J9601 Acute respiratory failure with hypoxia: Secondary | ICD-10-CM | POA: Diagnosis not present

## 2017-07-23 DIAGNOSIS — Z952 Presence of prosthetic heart valve: Secondary | ICD-10-CM | POA: Diagnosis not present

## 2017-07-23 DIAGNOSIS — I739 Peripheral vascular disease, unspecified: Secondary | ICD-10-CM | POA: Diagnosis not present

## 2017-07-23 DIAGNOSIS — J989 Respiratory disorder, unspecified: Secondary | ICD-10-CM | POA: Diagnosis not present

## 2017-07-23 DIAGNOSIS — J811 Chronic pulmonary edema: Secondary | ICD-10-CM | POA: Diagnosis not present

## 2017-07-24 DIAGNOSIS — J90 Pleural effusion, not elsewhere classified: Secondary | ICD-10-CM | POA: Diagnosis not present

## 2017-07-24 DIAGNOSIS — R569 Unspecified convulsions: Secondary | ICD-10-CM | POA: Diagnosis not present

## 2017-07-24 DIAGNOSIS — I342 Nonrheumatic mitral (valve) stenosis: Secondary | ICD-10-CM | POA: Diagnosis not present

## 2017-07-24 DIAGNOSIS — I5021 Acute systolic (congestive) heart failure: Secondary | ICD-10-CM | POA: Diagnosis not present

## 2017-07-24 DIAGNOSIS — I739 Peripheral vascular disease, unspecified: Secondary | ICD-10-CM | POA: Diagnosis not present

## 2017-07-24 DIAGNOSIS — J986 Disorders of diaphragm: Secondary | ICD-10-CM | POA: Diagnosis not present

## 2017-07-24 DIAGNOSIS — R69 Illness, unspecified: Secondary | ICD-10-CM | POA: Diagnosis not present

## 2017-07-24 DIAGNOSIS — I10 Essential (primary) hypertension: Secondary | ICD-10-CM | POA: Diagnosis not present

## 2017-07-24 DIAGNOSIS — I2510 Atherosclerotic heart disease of native coronary artery without angina pectoris: Secondary | ICD-10-CM | POA: Diagnosis not present

## 2017-07-24 DIAGNOSIS — N179 Acute kidney failure, unspecified: Secondary | ICD-10-CM | POA: Diagnosis not present

## 2017-07-24 DIAGNOSIS — Z952 Presence of prosthetic heart valve: Secondary | ICD-10-CM | POA: Diagnosis not present

## 2017-07-24 DIAGNOSIS — I5020 Unspecified systolic (congestive) heart failure: Secondary | ICD-10-CM | POA: Diagnosis not present

## 2017-07-24 DIAGNOSIS — J9601 Acute respiratory failure with hypoxia: Secondary | ICD-10-CM | POA: Diagnosis not present

## 2017-07-24 DIAGNOSIS — I4891 Unspecified atrial fibrillation: Secondary | ICD-10-CM | POA: Diagnosis not present

## 2017-07-25 DIAGNOSIS — I342 Nonrheumatic mitral (valve) stenosis: Secondary | ICD-10-CM | POA: Diagnosis not present

## 2017-07-25 DIAGNOSIS — I10 Essential (primary) hypertension: Secondary | ICD-10-CM | POA: Diagnosis not present

## 2017-07-25 DIAGNOSIS — J90 Pleural effusion, not elsewhere classified: Secondary | ICD-10-CM | POA: Diagnosis not present

## 2017-07-25 DIAGNOSIS — R69 Illness, unspecified: Secondary | ICD-10-CM | POA: Diagnosis not present

## 2017-07-25 DIAGNOSIS — I739 Peripheral vascular disease, unspecified: Secondary | ICD-10-CM | POA: Diagnosis not present

## 2017-07-25 DIAGNOSIS — R569 Unspecified convulsions: Secondary | ICD-10-CM | POA: Diagnosis not present

## 2017-07-25 DIAGNOSIS — I5020 Unspecified systolic (congestive) heart failure: Secondary | ICD-10-CM | POA: Diagnosis not present

## 2017-07-25 DIAGNOSIS — Z4682 Encounter for fitting and adjustment of non-vascular catheter: Secondary | ICD-10-CM | POA: Diagnosis not present

## 2017-07-25 DIAGNOSIS — I5021 Acute systolic (congestive) heart failure: Secondary | ICD-10-CM | POA: Diagnosis not present

## 2017-07-25 DIAGNOSIS — I4891 Unspecified atrial fibrillation: Secondary | ICD-10-CM | POA: Diagnosis not present

## 2017-07-25 DIAGNOSIS — I2510 Atherosclerotic heart disease of native coronary artery without angina pectoris: Secondary | ICD-10-CM | POA: Diagnosis not present

## 2017-07-25 DIAGNOSIS — J9601 Acute respiratory failure with hypoxia: Secondary | ICD-10-CM | POA: Diagnosis not present

## 2017-07-25 DIAGNOSIS — N179 Acute kidney failure, unspecified: Secondary | ICD-10-CM | POA: Diagnosis not present

## 2017-07-26 DIAGNOSIS — J989 Respiratory disorder, unspecified: Secondary | ICD-10-CM | POA: Diagnosis not present

## 2017-07-26 DIAGNOSIS — J90 Pleural effusion, not elsewhere classified: Secondary | ICD-10-CM | POA: Diagnosis not present

## 2017-07-26 DIAGNOSIS — J9601 Acute respiratory failure with hypoxia: Secondary | ICD-10-CM | POA: Diagnosis not present

## 2017-07-26 DIAGNOSIS — T8119XD Other postprocedural shock, subsequent encounter: Secondary | ICD-10-CM | POA: Diagnosis not present

## 2017-07-26 DIAGNOSIS — I5020 Unspecified systolic (congestive) heart failure: Secondary | ICD-10-CM | POA: Diagnosis not present

## 2017-07-26 DIAGNOSIS — I739 Peripheral vascular disease, unspecified: Secondary | ICD-10-CM | POA: Diagnosis not present

## 2017-07-26 DIAGNOSIS — I10 Essential (primary) hypertension: Secondary | ICD-10-CM | POA: Diagnosis not present

## 2017-07-26 DIAGNOSIS — I5021 Acute systolic (congestive) heart failure: Secondary | ICD-10-CM | POA: Diagnosis not present

## 2017-07-26 DIAGNOSIS — I342 Nonrheumatic mitral (valve) stenosis: Secondary | ICD-10-CM | POA: Diagnosis not present

## 2017-07-26 DIAGNOSIS — R69 Illness, unspecified: Secondary | ICD-10-CM | POA: Diagnosis not present

## 2017-07-26 DIAGNOSIS — I4891 Unspecified atrial fibrillation: Secondary | ICD-10-CM | POA: Diagnosis not present

## 2017-07-26 DIAGNOSIS — J811 Chronic pulmonary edema: Secondary | ICD-10-CM | POA: Diagnosis not present

## 2017-07-26 DIAGNOSIS — N179 Acute kidney failure, unspecified: Secondary | ICD-10-CM | POA: Diagnosis not present

## 2017-07-26 DIAGNOSIS — I2510 Atherosclerotic heart disease of native coronary artery without angina pectoris: Secondary | ICD-10-CM | POA: Diagnosis not present

## 2017-07-26 DIAGNOSIS — Z4682 Encounter for fitting and adjustment of non-vascular catheter: Secondary | ICD-10-CM | POA: Diagnosis not present

## 2017-07-26 DIAGNOSIS — R569 Unspecified convulsions: Secondary | ICD-10-CM | POA: Diagnosis not present

## 2017-07-26 DIAGNOSIS — Z952 Presence of prosthetic heart valve: Secondary | ICD-10-CM | POA: Diagnosis not present

## 2017-07-27 DIAGNOSIS — J95821 Acute postprocedural respiratory failure: Secondary | ICD-10-CM | POA: Diagnosis not present

## 2017-07-27 DIAGNOSIS — I2510 Atherosclerotic heart disease of native coronary artery without angina pectoris: Secondary | ICD-10-CM | POA: Diagnosis not present

## 2017-07-27 DIAGNOSIS — J150 Pneumonia due to Klebsiella pneumoniae: Secondary | ICD-10-CM | POA: Diagnosis not present

## 2017-07-27 DIAGNOSIS — J90 Pleural effusion, not elsewhere classified: Secondary | ICD-10-CM | POA: Diagnosis not present

## 2017-07-27 DIAGNOSIS — N183 Chronic kidney disease, stage 3 (moderate): Secondary | ICD-10-CM | POA: Diagnosis not present

## 2017-07-27 DIAGNOSIS — J9601 Acute respiratory failure with hypoxia: Secondary | ICD-10-CM | POA: Diagnosis not present

## 2017-07-27 DIAGNOSIS — I739 Peripheral vascular disease, unspecified: Secondary | ICD-10-CM | POA: Diagnosis not present

## 2017-07-27 DIAGNOSIS — I10 Essential (primary) hypertension: Secondary | ICD-10-CM | POA: Diagnosis not present

## 2017-07-27 DIAGNOSIS — J9602 Acute respiratory failure with hypercapnia: Secondary | ICD-10-CM | POA: Diagnosis not present

## 2017-07-27 DIAGNOSIS — Z4682 Encounter for fitting and adjustment of non-vascular catheter: Secondary | ICD-10-CM | POA: Diagnosis not present

## 2017-07-27 DIAGNOSIS — E1165 Type 2 diabetes mellitus with hyperglycemia: Secondary | ICD-10-CM | POA: Diagnosis not present

## 2017-07-27 DIAGNOSIS — I5021 Acute systolic (congestive) heart failure: Secondary | ICD-10-CM | POA: Diagnosis not present

## 2017-07-27 DIAGNOSIS — Z952 Presence of prosthetic heart valve: Secondary | ICD-10-CM | POA: Diagnosis not present

## 2017-07-27 DIAGNOSIS — I4891 Unspecified atrial fibrillation: Secondary | ICD-10-CM | POA: Diagnosis not present

## 2017-07-27 DIAGNOSIS — I5020 Unspecified systolic (congestive) heart failure: Secondary | ICD-10-CM | POA: Diagnosis not present

## 2017-07-27 DIAGNOSIS — I5023 Acute on chronic systolic (congestive) heart failure: Secondary | ICD-10-CM | POA: Diagnosis not present

## 2017-07-27 DIAGNOSIS — N179 Acute kidney failure, unspecified: Secondary | ICD-10-CM | POA: Diagnosis not present

## 2017-07-27 DIAGNOSIS — Z136 Encounter for screening for cardiovascular disorders: Secondary | ICD-10-CM | POA: Diagnosis not present

## 2017-07-27 DIAGNOSIS — T8119XD Other postprocedural shock, subsequent encounter: Secondary | ICD-10-CM | POA: Diagnosis not present

## 2017-07-27 DIAGNOSIS — J811 Chronic pulmonary edema: Secondary | ICD-10-CM | POA: Diagnosis not present

## 2017-07-27 DIAGNOSIS — R69 Illness, unspecified: Secondary | ICD-10-CM | POA: Diagnosis not present

## 2017-07-28 DIAGNOSIS — I5021 Acute systolic (congestive) heart failure: Secondary | ICD-10-CM | POA: Diagnosis not present

## 2017-07-28 DIAGNOSIS — R69 Illness, unspecified: Secondary | ICD-10-CM | POA: Diagnosis not present

## 2017-07-28 DIAGNOSIS — I5023 Acute on chronic systolic (congestive) heart failure: Secondary | ICD-10-CM | POA: Diagnosis not present

## 2017-07-28 DIAGNOSIS — Z45018 Encounter for adjustment and management of other part of cardiac pacemaker: Secondary | ICD-10-CM | POA: Diagnosis not present

## 2017-07-28 DIAGNOSIS — J9602 Acute respiratory failure with hypercapnia: Secondary | ICD-10-CM | POA: Diagnosis not present

## 2017-07-28 DIAGNOSIS — I10 Essential (primary) hypertension: Secondary | ICD-10-CM | POA: Diagnosis not present

## 2017-07-28 DIAGNOSIS — J90 Pleural effusion, not elsewhere classified: Secondary | ICD-10-CM | POA: Diagnosis not present

## 2017-07-28 DIAGNOSIS — R918 Other nonspecific abnormal finding of lung field: Secondary | ICD-10-CM | POA: Diagnosis not present

## 2017-07-28 DIAGNOSIS — J9600 Acute respiratory failure, unspecified whether with hypoxia or hypercapnia: Secondary | ICD-10-CM | POA: Diagnosis not present

## 2017-07-28 DIAGNOSIS — N179 Acute kidney failure, unspecified: Secondary | ICD-10-CM | POA: Diagnosis not present

## 2017-07-28 DIAGNOSIS — I739 Peripheral vascular disease, unspecified: Secondary | ICD-10-CM | POA: Diagnosis not present

## 2017-07-28 DIAGNOSIS — I5020 Unspecified systolic (congestive) heart failure: Secondary | ICD-10-CM | POA: Diagnosis not present

## 2017-07-28 DIAGNOSIS — J9811 Atelectasis: Secondary | ICD-10-CM | POA: Diagnosis not present

## 2017-07-28 DIAGNOSIS — J9690 Respiratory failure, unspecified, unspecified whether with hypoxia or hypercapnia: Secondary | ICD-10-CM | POA: Diagnosis not present

## 2017-07-28 DIAGNOSIS — I4891 Unspecified atrial fibrillation: Secondary | ICD-10-CM | POA: Diagnosis not present

## 2017-07-28 DIAGNOSIS — I442 Atrioventricular block, complete: Secondary | ICD-10-CM | POA: Diagnosis not present

## 2017-07-28 DIAGNOSIS — I2510 Atherosclerotic heart disease of native coronary artery without angina pectoris: Secondary | ICD-10-CM | POA: Diagnosis not present

## 2017-07-29 DIAGNOSIS — Z45018 Encounter for adjustment and management of other part of cardiac pacemaker: Secondary | ICD-10-CM | POA: Diagnosis not present

## 2017-07-29 DIAGNOSIS — I5020 Unspecified systolic (congestive) heart failure: Secondary | ICD-10-CM | POA: Diagnosis not present

## 2017-07-29 DIAGNOSIS — R69 Illness, unspecified: Secondary | ICD-10-CM | POA: Diagnosis not present

## 2017-07-29 DIAGNOSIS — I5023 Acute on chronic systolic (congestive) heart failure: Secondary | ICD-10-CM | POA: Diagnosis not present

## 2017-07-29 DIAGNOSIS — N179 Acute kidney failure, unspecified: Secondary | ICD-10-CM | POA: Diagnosis not present

## 2017-07-29 DIAGNOSIS — I739 Peripheral vascular disease, unspecified: Secondary | ICD-10-CM | POA: Diagnosis not present

## 2017-07-29 DIAGNOSIS — I442 Atrioventricular block, complete: Secondary | ICD-10-CM | POA: Diagnosis not present

## 2017-07-29 DIAGNOSIS — R918 Other nonspecific abnormal finding of lung field: Secondary | ICD-10-CM | POA: Diagnosis not present

## 2017-07-29 DIAGNOSIS — I10 Essential (primary) hypertension: Secondary | ICD-10-CM | POA: Diagnosis not present

## 2017-07-29 DIAGNOSIS — I4891 Unspecified atrial fibrillation: Secondary | ICD-10-CM | POA: Diagnosis not present

## 2017-07-29 DIAGNOSIS — Z4682 Encounter for fitting and adjustment of non-vascular catheter: Secondary | ICD-10-CM | POA: Diagnosis not present

## 2017-07-29 DIAGNOSIS — J90 Pleural effusion, not elsewhere classified: Secondary | ICD-10-CM | POA: Diagnosis not present

## 2017-07-29 DIAGNOSIS — I2510 Atherosclerotic heart disease of native coronary artery without angina pectoris: Secondary | ICD-10-CM | POA: Diagnosis not present

## 2017-07-29 DIAGNOSIS — I5021 Acute systolic (congestive) heart failure: Secondary | ICD-10-CM | POA: Diagnosis not present

## 2017-07-29 DIAGNOSIS — I517 Cardiomegaly: Secondary | ICD-10-CM | POA: Diagnosis not present

## 2017-07-29 DIAGNOSIS — J9600 Acute respiratory failure, unspecified whether with hypoxia or hypercapnia: Secondary | ICD-10-CM | POA: Diagnosis not present

## 2017-07-30 DIAGNOSIS — I4891 Unspecified atrial fibrillation: Secondary | ICD-10-CM | POA: Diagnosis not present

## 2017-07-30 DIAGNOSIS — I5020 Unspecified systolic (congestive) heart failure: Secondary | ICD-10-CM | POA: Diagnosis not present

## 2017-07-30 DIAGNOSIS — I517 Cardiomegaly: Secondary | ICD-10-CM | POA: Diagnosis not present

## 2017-07-30 DIAGNOSIS — I5021 Acute systolic (congestive) heart failure: Secondary | ICD-10-CM | POA: Diagnosis not present

## 2017-07-30 DIAGNOSIS — I350 Nonrheumatic aortic (valve) stenosis: Secondary | ICD-10-CM | POA: Diagnosis not present

## 2017-07-30 DIAGNOSIS — I739 Peripheral vascular disease, unspecified: Secondary | ICD-10-CM | POA: Diagnosis not present

## 2017-07-30 DIAGNOSIS — N179 Acute kidney failure, unspecified: Secondary | ICD-10-CM | POA: Diagnosis not present

## 2017-07-30 DIAGNOSIS — R69 Illness, unspecified: Secondary | ICD-10-CM | POA: Diagnosis not present

## 2017-07-30 DIAGNOSIS — I2510 Atherosclerotic heart disease of native coronary artery without angina pectoris: Secondary | ICD-10-CM | POA: Diagnosis not present

## 2017-07-30 DIAGNOSIS — J90 Pleural effusion, not elsewhere classified: Secondary | ICD-10-CM | POA: Diagnosis not present

## 2017-07-30 DIAGNOSIS — R918 Other nonspecific abnormal finding of lung field: Secondary | ICD-10-CM | POA: Diagnosis not present

## 2017-07-30 DIAGNOSIS — Z4682 Encounter for fitting and adjustment of non-vascular catheter: Secondary | ICD-10-CM | POA: Diagnosis not present

## 2017-07-30 DIAGNOSIS — J9600 Acute respiratory failure, unspecified whether with hypoxia or hypercapnia: Secondary | ICD-10-CM | POA: Diagnosis not present

## 2017-07-30 DIAGNOSIS — I10 Essential (primary) hypertension: Secondary | ICD-10-CM | POA: Diagnosis not present

## 2017-07-31 DIAGNOSIS — I4891 Unspecified atrial fibrillation: Secondary | ICD-10-CM | POA: Diagnosis not present

## 2017-07-31 DIAGNOSIS — I2510 Atherosclerotic heart disease of native coronary artery without angina pectoris: Secondary | ICD-10-CM | POA: Diagnosis not present

## 2017-07-31 DIAGNOSIS — R69 Illness, unspecified: Secondary | ICD-10-CM | POA: Diagnosis not present

## 2017-07-31 DIAGNOSIS — N179 Acute kidney failure, unspecified: Secondary | ICD-10-CM | POA: Diagnosis not present

## 2017-07-31 DIAGNOSIS — I350 Nonrheumatic aortic (valve) stenosis: Secondary | ICD-10-CM | POA: Diagnosis not present

## 2017-07-31 DIAGNOSIS — I10 Essential (primary) hypertension: Secondary | ICD-10-CM | POA: Diagnosis not present

## 2017-07-31 DIAGNOSIS — I5021 Acute systolic (congestive) heart failure: Secondary | ICD-10-CM | POA: Diagnosis not present

## 2017-07-31 DIAGNOSIS — J9602 Acute respiratory failure with hypercapnia: Secondary | ICD-10-CM | POA: Diagnosis not present

## 2017-07-31 DIAGNOSIS — Z48813 Encounter for surgical aftercare following surgery on the respiratory system: Secondary | ICD-10-CM | POA: Diagnosis not present

## 2017-07-31 DIAGNOSIS — I5020 Unspecified systolic (congestive) heart failure: Secondary | ICD-10-CM | POA: Diagnosis not present

## 2017-07-31 DIAGNOSIS — J9600 Acute respiratory failure, unspecified whether with hypoxia or hypercapnia: Secondary | ICD-10-CM | POA: Diagnosis not present

## 2017-07-31 DIAGNOSIS — I739 Peripheral vascular disease, unspecified: Secondary | ICD-10-CM | POA: Diagnosis not present

## 2017-08-01 DIAGNOSIS — N179 Acute kidney failure, unspecified: Secondary | ICD-10-CM | POA: Diagnosis not present

## 2017-08-01 DIAGNOSIS — E1165 Type 2 diabetes mellitus with hyperglycemia: Secondary | ICD-10-CM | POA: Diagnosis not present

## 2017-08-01 DIAGNOSIS — I5020 Unspecified systolic (congestive) heart failure: Secondary | ICD-10-CM | POA: Diagnosis not present

## 2017-08-01 DIAGNOSIS — J9600 Acute respiratory failure, unspecified whether with hypoxia or hypercapnia: Secondary | ICD-10-CM | POA: Diagnosis not present

## 2017-08-01 DIAGNOSIS — I5023 Acute on chronic systolic (congestive) heart failure: Secondary | ICD-10-CM | POA: Diagnosis not present

## 2017-08-01 DIAGNOSIS — I739 Peripheral vascular disease, unspecified: Secondary | ICD-10-CM | POA: Diagnosis not present

## 2017-08-01 DIAGNOSIS — E870 Hyperosmolality and hypernatremia: Secondary | ICD-10-CM | POA: Diagnosis not present

## 2017-08-01 DIAGNOSIS — I2510 Atherosclerotic heart disease of native coronary artery without angina pectoris: Secondary | ICD-10-CM | POA: Diagnosis not present

## 2017-08-01 DIAGNOSIS — I10 Essential (primary) hypertension: Secondary | ICD-10-CM | POA: Diagnosis not present

## 2017-08-01 DIAGNOSIS — N183 Chronic kidney disease, stage 3 (moderate): Secondary | ICD-10-CM | POA: Diagnosis not present

## 2017-08-01 DIAGNOSIS — Z930 Tracheostomy status: Secondary | ICD-10-CM | POA: Diagnosis not present

## 2017-08-01 DIAGNOSIS — I4891 Unspecified atrial fibrillation: Secondary | ICD-10-CM | POA: Diagnosis not present

## 2017-08-01 DIAGNOSIS — I5021 Acute systolic (congestive) heart failure: Secondary | ICD-10-CM | POA: Diagnosis not present

## 2017-08-01 DIAGNOSIS — J95821 Acute postprocedural respiratory failure: Secondary | ICD-10-CM | POA: Diagnosis not present

## 2017-08-01 DIAGNOSIS — R69 Illness, unspecified: Secondary | ICD-10-CM | POA: Diagnosis not present

## 2017-08-01 DIAGNOSIS — J150 Pneumonia due to Klebsiella pneumoniae: Secondary | ICD-10-CM | POA: Diagnosis not present

## 2017-08-02 DIAGNOSIS — J90 Pleural effusion, not elsewhere classified: Secondary | ICD-10-CM | POA: Diagnosis not present

## 2017-08-02 DIAGNOSIS — I2510 Atherosclerotic heart disease of native coronary artery without angina pectoris: Secondary | ICD-10-CM | POA: Diagnosis not present

## 2017-08-02 DIAGNOSIS — Z45018 Encounter for adjustment and management of other part of cardiac pacemaker: Secondary | ICD-10-CM | POA: Diagnosis not present

## 2017-08-02 DIAGNOSIS — J811 Chronic pulmonary edema: Secondary | ICD-10-CM | POA: Diagnosis not present

## 2017-08-02 DIAGNOSIS — I5021 Acute systolic (congestive) heart failure: Secondary | ICD-10-CM | POA: Diagnosis not present

## 2017-08-02 DIAGNOSIS — I5023 Acute on chronic systolic (congestive) heart failure: Secondary | ICD-10-CM | POA: Diagnosis not present

## 2017-08-02 DIAGNOSIS — J9811 Atelectasis: Secondary | ICD-10-CM | POA: Diagnosis not present

## 2017-08-02 DIAGNOSIS — Z4682 Encounter for fitting and adjustment of non-vascular catheter: Secondary | ICD-10-CM | POA: Diagnosis not present

## 2017-08-02 DIAGNOSIS — R69 Illness, unspecified: Secondary | ICD-10-CM | POA: Diagnosis not present

## 2017-08-02 DIAGNOSIS — Z9911 Dependence on respirator [ventilator] status: Secondary | ICD-10-CM | POA: Diagnosis not present

## 2017-08-02 DIAGNOSIS — I739 Peripheral vascular disease, unspecified: Secondary | ICD-10-CM | POA: Diagnosis not present

## 2017-08-02 DIAGNOSIS — J9600 Acute respiratory failure, unspecified whether with hypoxia or hypercapnia: Secondary | ICD-10-CM | POA: Diagnosis not present

## 2017-08-02 DIAGNOSIS — N179 Acute kidney failure, unspecified: Secondary | ICD-10-CM | POA: Diagnosis not present

## 2017-08-02 DIAGNOSIS — I5020 Unspecified systolic (congestive) heart failure: Secondary | ICD-10-CM | POA: Diagnosis not present

## 2017-08-02 DIAGNOSIS — J9690 Respiratory failure, unspecified, unspecified whether with hypoxia or hypercapnia: Secondary | ICD-10-CM | POA: Diagnosis not present

## 2017-08-02 DIAGNOSIS — I4891 Unspecified atrial fibrillation: Secondary | ICD-10-CM | POA: Diagnosis not present

## 2017-08-02 DIAGNOSIS — J9601 Acute respiratory failure with hypoxia: Secondary | ICD-10-CM | POA: Diagnosis not present

## 2017-08-02 DIAGNOSIS — I10 Essential (primary) hypertension: Secondary | ICD-10-CM | POA: Diagnosis not present

## 2017-08-02 DIAGNOSIS — R918 Other nonspecific abnormal finding of lung field: Secondary | ICD-10-CM | POA: Diagnosis not present

## 2017-08-03 DIAGNOSIS — J9600 Acute respiratory failure, unspecified whether with hypoxia or hypercapnia: Secondary | ICD-10-CM | POA: Diagnosis not present

## 2017-08-03 DIAGNOSIS — Z930 Tracheostomy status: Secondary | ICD-10-CM | POA: Diagnosis not present

## 2017-08-03 DIAGNOSIS — I10 Essential (primary) hypertension: Secondary | ICD-10-CM | POA: Diagnosis not present

## 2017-08-03 DIAGNOSIS — R1311 Dysphagia, oral phase: Secondary | ICD-10-CM | POA: Diagnosis not present

## 2017-08-03 DIAGNOSIS — I739 Peripheral vascular disease, unspecified: Secondary | ICD-10-CM | POA: Diagnosis not present

## 2017-08-03 DIAGNOSIS — J90 Pleural effusion, not elsewhere classified: Secondary | ICD-10-CM | POA: Diagnosis not present

## 2017-08-03 DIAGNOSIS — I5020 Unspecified systolic (congestive) heart failure: Secondary | ICD-10-CM | POA: Diagnosis not present

## 2017-08-03 DIAGNOSIS — I5021 Acute systolic (congestive) heart failure: Secondary | ICD-10-CM | POA: Diagnosis not present

## 2017-08-03 DIAGNOSIS — J9601 Acute respiratory failure with hypoxia: Secondary | ICD-10-CM | POA: Diagnosis not present

## 2017-08-03 DIAGNOSIS — I4891 Unspecified atrial fibrillation: Secondary | ICD-10-CM | POA: Diagnosis not present

## 2017-08-03 DIAGNOSIS — I2510 Atherosclerotic heart disease of native coronary artery without angina pectoris: Secondary | ICD-10-CM | POA: Diagnosis not present

## 2017-08-03 DIAGNOSIS — N179 Acute kidney failure, unspecified: Secondary | ICD-10-CM | POA: Diagnosis not present

## 2017-08-04 DIAGNOSIS — J9602 Acute respiratory failure with hypercapnia: Secondary | ICD-10-CM | POA: Diagnosis not present

## 2017-08-04 DIAGNOSIS — I2510 Atherosclerotic heart disease of native coronary artery without angina pectoris: Secondary | ICD-10-CM | POA: Diagnosis not present

## 2017-08-04 DIAGNOSIS — I739 Peripheral vascular disease, unspecified: Secondary | ICD-10-CM | POA: Diagnosis not present

## 2017-08-04 DIAGNOSIS — I4891 Unspecified atrial fibrillation: Secondary | ICD-10-CM | POA: Diagnosis not present

## 2017-08-04 DIAGNOSIS — I499 Cardiac arrhythmia, unspecified: Secondary | ICD-10-CM | POA: Diagnosis not present

## 2017-08-04 DIAGNOSIS — J9600 Acute respiratory failure, unspecified whether with hypoxia or hypercapnia: Secondary | ICD-10-CM | POA: Diagnosis not present

## 2017-08-04 DIAGNOSIS — I5021 Acute systolic (congestive) heart failure: Secondary | ICD-10-CM | POA: Diagnosis not present

## 2017-08-04 DIAGNOSIS — J90 Pleural effusion, not elsewhere classified: Secondary | ICD-10-CM | POA: Diagnosis not present

## 2017-08-04 DIAGNOSIS — R1311 Dysphagia, oral phase: Secondary | ICD-10-CM | POA: Diagnosis not present

## 2017-08-04 DIAGNOSIS — J9601 Acute respiratory failure with hypoxia: Secondary | ICD-10-CM | POA: Diagnosis not present

## 2017-08-04 DIAGNOSIS — N179 Acute kidney failure, unspecified: Secondary | ICD-10-CM | POA: Diagnosis not present

## 2017-08-04 DIAGNOSIS — I10 Essential (primary) hypertension: Secondary | ICD-10-CM | POA: Diagnosis not present

## 2017-08-04 DIAGNOSIS — K2970 Gastritis, unspecified, without bleeding: Secondary | ICD-10-CM | POA: Diagnosis not present

## 2017-08-04 DIAGNOSIS — I5020 Unspecified systolic (congestive) heart failure: Secondary | ICD-10-CM | POA: Diagnosis not present

## 2017-08-05 DIAGNOSIS — J9690 Respiratory failure, unspecified, unspecified whether with hypoxia or hypercapnia: Secondary | ICD-10-CM | POA: Diagnosis not present

## 2017-08-05 DIAGNOSIS — I10 Essential (primary) hypertension: Secondary | ICD-10-CM | POA: Diagnosis not present

## 2017-08-05 DIAGNOSIS — J9601 Acute respiratory failure with hypoxia: Secondary | ICD-10-CM | POA: Diagnosis not present

## 2017-08-05 DIAGNOSIS — R1084 Generalized abdominal pain: Secondary | ICD-10-CM | POA: Diagnosis not present

## 2017-08-05 DIAGNOSIS — N179 Acute kidney failure, unspecified: Secondary | ICD-10-CM | POA: Diagnosis not present

## 2017-08-05 DIAGNOSIS — R918 Other nonspecific abnormal finding of lung field: Secondary | ICD-10-CM | POA: Diagnosis not present

## 2017-08-05 DIAGNOSIS — I739 Peripheral vascular disease, unspecified: Secondary | ICD-10-CM | POA: Diagnosis not present

## 2017-08-05 DIAGNOSIS — J90 Pleural effusion, not elsewhere classified: Secondary | ICD-10-CM | POA: Diagnosis not present

## 2017-08-05 DIAGNOSIS — J811 Chronic pulmonary edema: Secondary | ICD-10-CM | POA: Diagnosis not present

## 2017-08-05 DIAGNOSIS — I5020 Unspecified systolic (congestive) heart failure: Secondary | ICD-10-CM | POA: Diagnosis not present

## 2017-08-05 DIAGNOSIS — Z4682 Encounter for fitting and adjustment of non-vascular catheter: Secondary | ICD-10-CM | POA: Diagnosis not present

## 2017-08-05 DIAGNOSIS — J9811 Atelectasis: Secondary | ICD-10-CM | POA: Diagnosis not present

## 2017-08-05 DIAGNOSIS — I4891 Unspecified atrial fibrillation: Secondary | ICD-10-CM | POA: Diagnosis not present

## 2017-08-05 DIAGNOSIS — I2510 Atherosclerotic heart disease of native coronary artery without angina pectoris: Secondary | ICD-10-CM | POA: Diagnosis not present

## 2017-08-05 DIAGNOSIS — I5021 Acute systolic (congestive) heart failure: Secondary | ICD-10-CM | POA: Diagnosis not present

## 2017-08-06 DIAGNOSIS — I2510 Atherosclerotic heart disease of native coronary artery without angina pectoris: Secondary | ICD-10-CM | POA: Diagnosis not present

## 2017-08-06 DIAGNOSIS — J9600 Acute respiratory failure, unspecified whether with hypoxia or hypercapnia: Secondary | ICD-10-CM | POA: Diagnosis not present

## 2017-08-06 DIAGNOSIS — R1084 Generalized abdominal pain: Secondary | ICD-10-CM | POA: Diagnosis not present

## 2017-08-06 DIAGNOSIS — J9811 Atelectasis: Secondary | ICD-10-CM | POA: Diagnosis not present

## 2017-08-06 DIAGNOSIS — I10 Essential (primary) hypertension: Secondary | ICD-10-CM | POA: Diagnosis not present

## 2017-08-06 DIAGNOSIS — R918 Other nonspecific abnormal finding of lung field: Secondary | ICD-10-CM | POA: Diagnosis not present

## 2017-08-06 DIAGNOSIS — J90 Pleural effusion, not elsewhere classified: Secondary | ICD-10-CM | POA: Diagnosis not present

## 2017-08-06 DIAGNOSIS — I5021 Acute systolic (congestive) heart failure: Secondary | ICD-10-CM | POA: Diagnosis not present

## 2017-08-06 DIAGNOSIS — R69 Illness, unspecified: Secondary | ICD-10-CM | POA: Diagnosis not present

## 2017-08-06 DIAGNOSIS — I4891 Unspecified atrial fibrillation: Secondary | ICD-10-CM | POA: Diagnosis not present

## 2017-08-06 DIAGNOSIS — Z4682 Encounter for fitting and adjustment of non-vascular catheter: Secondary | ICD-10-CM | POA: Diagnosis not present

## 2017-08-06 DIAGNOSIS — I739 Peripheral vascular disease, unspecified: Secondary | ICD-10-CM | POA: Diagnosis not present

## 2017-08-06 DIAGNOSIS — I350 Nonrheumatic aortic (valve) stenosis: Secondary | ICD-10-CM | POA: Diagnosis not present

## 2017-08-06 DIAGNOSIS — J811 Chronic pulmonary edema: Secondary | ICD-10-CM | POA: Diagnosis not present

## 2017-08-06 DIAGNOSIS — J9690 Respiratory failure, unspecified, unspecified whether with hypoxia or hypercapnia: Secondary | ICD-10-CM | POA: Diagnosis not present

## 2017-08-06 DIAGNOSIS — I5020 Unspecified systolic (congestive) heart failure: Secondary | ICD-10-CM | POA: Diagnosis not present

## 2017-08-06 DIAGNOSIS — N179 Acute kidney failure, unspecified: Secondary | ICD-10-CM | POA: Diagnosis not present

## 2017-08-07 DIAGNOSIS — J9690 Respiratory failure, unspecified, unspecified whether with hypoxia or hypercapnia: Secondary | ICD-10-CM | POA: Diagnosis not present

## 2017-08-07 DIAGNOSIS — Z4682 Encounter for fitting and adjustment of non-vascular catheter: Secondary | ICD-10-CM | POA: Diagnosis not present

## 2017-08-07 DIAGNOSIS — I10 Essential (primary) hypertension: Secondary | ICD-10-CM | POA: Diagnosis not present

## 2017-08-07 DIAGNOSIS — I5021 Acute systolic (congestive) heart failure: Secondary | ICD-10-CM | POA: Diagnosis not present

## 2017-08-07 DIAGNOSIS — I739 Peripheral vascular disease, unspecified: Secondary | ICD-10-CM | POA: Diagnosis not present

## 2017-08-07 DIAGNOSIS — J90 Pleural effusion, not elsewhere classified: Secondary | ICD-10-CM | POA: Diagnosis not present

## 2017-08-07 DIAGNOSIS — I5020 Unspecified systolic (congestive) heart failure: Secondary | ICD-10-CM | POA: Diagnosis not present

## 2017-08-07 DIAGNOSIS — I517 Cardiomegaly: Secondary | ICD-10-CM | POA: Diagnosis not present

## 2017-08-07 DIAGNOSIS — R109 Unspecified abdominal pain: Secondary | ICD-10-CM | POA: Diagnosis not present

## 2017-08-07 DIAGNOSIS — I2510 Atherosclerotic heart disease of native coronary artery without angina pectoris: Secondary | ICD-10-CM | POA: Diagnosis not present

## 2017-08-07 DIAGNOSIS — R1311 Dysphagia, oral phase: Secondary | ICD-10-CM | POA: Diagnosis not present

## 2017-08-07 DIAGNOSIS — N179 Acute kidney failure, unspecified: Secondary | ICD-10-CM | POA: Diagnosis not present

## 2017-08-07 DIAGNOSIS — I4891 Unspecified atrial fibrillation: Secondary | ICD-10-CM | POA: Diagnosis not present

## 2017-08-07 DIAGNOSIS — J9600 Acute respiratory failure, unspecified whether with hypoxia or hypercapnia: Secondary | ICD-10-CM | POA: Diagnosis not present

## 2017-08-07 DIAGNOSIS — R69 Illness, unspecified: Secondary | ICD-10-CM | POA: Diagnosis not present

## 2017-08-07 DIAGNOSIS — I350 Nonrheumatic aortic (valve) stenosis: Secondary | ICD-10-CM | POA: Diagnosis not present

## 2017-08-08 DIAGNOSIS — I5020 Unspecified systolic (congestive) heart failure: Secondary | ICD-10-CM | POA: Diagnosis not present

## 2017-08-08 DIAGNOSIS — I739 Peripheral vascular disease, unspecified: Secondary | ICD-10-CM | POA: Diagnosis not present

## 2017-08-08 DIAGNOSIS — I2510 Atherosclerotic heart disease of native coronary artery without angina pectoris: Secondary | ICD-10-CM | POA: Diagnosis not present

## 2017-08-08 DIAGNOSIS — Z4682 Encounter for fitting and adjustment of non-vascular catheter: Secondary | ICD-10-CM | POA: Diagnosis not present

## 2017-08-08 DIAGNOSIS — N179 Acute kidney failure, unspecified: Secondary | ICD-10-CM | POA: Diagnosis not present

## 2017-08-08 DIAGNOSIS — I5023 Acute on chronic systolic (congestive) heart failure: Secondary | ICD-10-CM | POA: Diagnosis not present

## 2017-08-08 DIAGNOSIS — R1311 Dysphagia, oral phase: Secondary | ICD-10-CM | POA: Diagnosis not present

## 2017-08-08 DIAGNOSIS — R69 Illness, unspecified: Secondary | ICD-10-CM | POA: Diagnosis not present

## 2017-08-08 DIAGNOSIS — Z452 Encounter for adjustment and management of vascular access device: Secondary | ICD-10-CM | POA: Diagnosis not present

## 2017-08-08 DIAGNOSIS — Z9911 Dependence on respirator [ventilator] status: Secondary | ICD-10-CM | POA: Diagnosis not present

## 2017-08-08 DIAGNOSIS — I4891 Unspecified atrial fibrillation: Secondary | ICD-10-CM | POA: Diagnosis not present

## 2017-08-08 DIAGNOSIS — I050 Rheumatic mitral stenosis: Secondary | ICD-10-CM | POA: Diagnosis not present

## 2017-08-08 DIAGNOSIS — I5021 Acute systolic (congestive) heart failure: Secondary | ICD-10-CM | POA: Diagnosis not present

## 2017-08-08 DIAGNOSIS — J90 Pleural effusion, not elsewhere classified: Secondary | ICD-10-CM | POA: Diagnosis not present

## 2017-08-08 DIAGNOSIS — J9610 Chronic respiratory failure, unspecified whether with hypoxia or hypercapnia: Secondary | ICD-10-CM | POA: Diagnosis not present

## 2017-08-08 DIAGNOSIS — Z45018 Encounter for adjustment and management of other part of cardiac pacemaker: Secondary | ICD-10-CM | POA: Diagnosis not present

## 2017-08-08 DIAGNOSIS — I10 Essential (primary) hypertension: Secondary | ICD-10-CM | POA: Diagnosis not present

## 2017-08-08 DIAGNOSIS — I517 Cardiomegaly: Secondary | ICD-10-CM | POA: Diagnosis not present

## 2017-08-09 ENCOUNTER — Encounter: Payer: Medicare HMO | Admitting: Vascular Surgery

## 2017-08-09 DIAGNOSIS — J9610 Chronic respiratory failure, unspecified whether with hypoxia or hypercapnia: Secondary | ICD-10-CM | POA: Diagnosis not present

## 2017-08-09 DIAGNOSIS — I5020 Unspecified systolic (congestive) heart failure: Secondary | ICD-10-CM | POA: Diagnosis not present

## 2017-08-09 DIAGNOSIS — I10 Essential (primary) hypertension: Secondary | ICD-10-CM | POA: Diagnosis not present

## 2017-08-09 DIAGNOSIS — Z452 Encounter for adjustment and management of vascular access device: Secondary | ICD-10-CM | POA: Diagnosis not present

## 2017-08-09 DIAGNOSIS — R1311 Dysphagia, oral phase: Secondary | ICD-10-CM | POA: Diagnosis not present

## 2017-08-09 DIAGNOSIS — I739 Peripheral vascular disease, unspecified: Secondary | ICD-10-CM | POA: Diagnosis not present

## 2017-08-09 DIAGNOSIS — N179 Acute kidney failure, unspecified: Secondary | ICD-10-CM | POA: Diagnosis not present

## 2017-08-09 DIAGNOSIS — I4891 Unspecified atrial fibrillation: Secondary | ICD-10-CM | POA: Diagnosis not present

## 2017-08-09 DIAGNOSIS — I5021 Acute systolic (congestive) heart failure: Secondary | ICD-10-CM | POA: Diagnosis not present

## 2017-08-09 DIAGNOSIS — I2510 Atherosclerotic heart disease of native coronary artery without angina pectoris: Secondary | ICD-10-CM | POA: Diagnosis not present

## 2017-08-09 DIAGNOSIS — F05 Delirium due to known physiological condition: Secondary | ICD-10-CM | POA: Diagnosis not present

## 2017-08-09 DIAGNOSIS — I5023 Acute on chronic systolic (congestive) heart failure: Secondary | ICD-10-CM | POA: Diagnosis not present

## 2017-08-09 DIAGNOSIS — I517 Cardiomegaly: Secondary | ICD-10-CM | POA: Diagnosis not present

## 2017-08-09 DIAGNOSIS — Z4682 Encounter for fitting and adjustment of non-vascular catheter: Secondary | ICD-10-CM | POA: Diagnosis not present

## 2017-08-09 DIAGNOSIS — J90 Pleural effusion, not elsewhere classified: Secondary | ICD-10-CM | POA: Diagnosis not present

## 2017-08-09 DIAGNOSIS — J9600 Acute respiratory failure, unspecified whether with hypoxia or hypercapnia: Secondary | ICD-10-CM | POA: Diagnosis not present

## 2017-08-09 DIAGNOSIS — R69 Illness, unspecified: Secondary | ICD-10-CM | POA: Diagnosis not present

## 2017-08-09 DIAGNOSIS — Z9911 Dependence on respirator [ventilator] status: Secondary | ICD-10-CM | POA: Diagnosis not present

## 2017-08-09 DIAGNOSIS — I050 Rheumatic mitral stenosis: Secondary | ICD-10-CM | POA: Diagnosis not present

## 2017-08-10 DIAGNOSIS — I5020 Unspecified systolic (congestive) heart failure: Secondary | ICD-10-CM | POA: Diagnosis not present

## 2017-08-10 DIAGNOSIS — R69 Illness, unspecified: Secondary | ICD-10-CM | POA: Diagnosis not present

## 2017-08-10 DIAGNOSIS — I5021 Acute systolic (congestive) heart failure: Secondary | ICD-10-CM | POA: Diagnosis not present

## 2017-08-10 DIAGNOSIS — Z452 Encounter for adjustment and management of vascular access device: Secondary | ICD-10-CM | POA: Diagnosis not present

## 2017-08-10 DIAGNOSIS — I050 Rheumatic mitral stenosis: Secondary | ICD-10-CM | POA: Diagnosis not present

## 2017-08-10 DIAGNOSIS — R1311 Dysphagia, oral phase: Secondary | ICD-10-CM | POA: Diagnosis not present

## 2017-08-10 DIAGNOSIS — I739 Peripheral vascular disease, unspecified: Secondary | ICD-10-CM | POA: Diagnosis not present

## 2017-08-10 DIAGNOSIS — Z4682 Encounter for fitting and adjustment of non-vascular catheter: Secondary | ICD-10-CM | POA: Diagnosis not present

## 2017-08-10 DIAGNOSIS — Z9911 Dependence on respirator [ventilator] status: Secondary | ICD-10-CM | POA: Diagnosis not present

## 2017-08-10 DIAGNOSIS — I517 Cardiomegaly: Secondary | ICD-10-CM | POA: Diagnosis not present

## 2017-08-10 DIAGNOSIS — J9610 Chronic respiratory failure, unspecified whether with hypoxia or hypercapnia: Secondary | ICD-10-CM | POA: Diagnosis not present

## 2017-08-10 DIAGNOSIS — N179 Acute kidney failure, unspecified: Secondary | ICD-10-CM | POA: Diagnosis not present

## 2017-08-10 DIAGNOSIS — I5023 Acute on chronic systolic (congestive) heart failure: Secondary | ICD-10-CM | POA: Diagnosis not present

## 2017-08-10 DIAGNOSIS — I10 Essential (primary) hypertension: Secondary | ICD-10-CM | POA: Diagnosis not present

## 2017-08-10 DIAGNOSIS — J90 Pleural effusion, not elsewhere classified: Secondary | ICD-10-CM | POA: Diagnosis not present

## 2017-08-10 DIAGNOSIS — I2510 Atherosclerotic heart disease of native coronary artery without angina pectoris: Secondary | ICD-10-CM | POA: Diagnosis not present

## 2017-08-10 DIAGNOSIS — I4891 Unspecified atrial fibrillation: Secondary | ICD-10-CM | POA: Diagnosis not present

## 2017-08-11 DIAGNOSIS — I5023 Acute on chronic systolic (congestive) heart failure: Secondary | ICD-10-CM | POA: Diagnosis not present

## 2017-08-11 DIAGNOSIS — I050 Rheumatic mitral stenosis: Secondary | ICD-10-CM | POA: Diagnosis not present

## 2017-08-11 DIAGNOSIS — R1311 Dysphagia, oral phase: Secondary | ICD-10-CM | POA: Diagnosis not present

## 2017-08-11 DIAGNOSIS — N179 Acute kidney failure, unspecified: Secondary | ICD-10-CM | POA: Diagnosis not present

## 2017-08-11 DIAGNOSIS — I5020 Unspecified systolic (congestive) heart failure: Secondary | ICD-10-CM | POA: Diagnosis not present

## 2017-08-11 DIAGNOSIS — R69 Illness, unspecified: Secondary | ICD-10-CM | POA: Diagnosis not present

## 2017-08-11 DIAGNOSIS — I517 Cardiomegaly: Secondary | ICD-10-CM | POA: Diagnosis not present

## 2017-08-11 DIAGNOSIS — I4891 Unspecified atrial fibrillation: Secondary | ICD-10-CM | POA: Diagnosis not present

## 2017-08-11 DIAGNOSIS — I2510 Atherosclerotic heart disease of native coronary artery without angina pectoris: Secondary | ICD-10-CM | POA: Diagnosis not present

## 2017-08-11 DIAGNOSIS — I5021 Acute systolic (congestive) heart failure: Secondary | ICD-10-CM | POA: Diagnosis not present

## 2017-08-11 DIAGNOSIS — Z452 Encounter for adjustment and management of vascular access device: Secondary | ICD-10-CM | POA: Diagnosis not present

## 2017-08-11 DIAGNOSIS — J9601 Acute respiratory failure with hypoxia: Secondary | ICD-10-CM | POA: Diagnosis not present

## 2017-08-11 DIAGNOSIS — J90 Pleural effusion, not elsewhere classified: Secondary | ICD-10-CM | POA: Diagnosis not present

## 2017-08-11 DIAGNOSIS — T8119XD Other postprocedural shock, subsequent encounter: Secondary | ICD-10-CM | POA: Diagnosis not present

## 2017-08-11 DIAGNOSIS — J9610 Chronic respiratory failure, unspecified whether with hypoxia or hypercapnia: Secondary | ICD-10-CM | POA: Diagnosis not present

## 2017-08-11 DIAGNOSIS — Z9911 Dependence on respirator [ventilator] status: Secondary | ICD-10-CM | POA: Diagnosis not present

## 2017-08-11 DIAGNOSIS — I10 Essential (primary) hypertension: Secondary | ICD-10-CM | POA: Diagnosis not present

## 2017-08-11 DIAGNOSIS — Z4682 Encounter for fitting and adjustment of non-vascular catheter: Secondary | ICD-10-CM | POA: Diagnosis not present

## 2017-08-11 DIAGNOSIS — Z930 Tracheostomy status: Secondary | ICD-10-CM | POA: Diagnosis not present

## 2017-08-11 DIAGNOSIS — I739 Peripheral vascular disease, unspecified: Secondary | ICD-10-CM | POA: Diagnosis not present

## 2017-08-12 DIAGNOSIS — R69 Illness, unspecified: Secondary | ICD-10-CM | POA: Diagnosis not present

## 2017-08-12 DIAGNOSIS — I5021 Acute systolic (congestive) heart failure: Secondary | ICD-10-CM | POA: Diagnosis not present

## 2017-08-12 DIAGNOSIS — I5020 Unspecified systolic (congestive) heart failure: Secondary | ICD-10-CM | POA: Diagnosis not present

## 2017-08-12 DIAGNOSIS — N183 Chronic kidney disease, stage 3 (moderate): Secondary | ICD-10-CM | POA: Diagnosis not present

## 2017-08-12 DIAGNOSIS — J90 Pleural effusion, not elsewhere classified: Secondary | ICD-10-CM | POA: Diagnosis not present

## 2017-08-12 DIAGNOSIS — I2510 Atherosclerotic heart disease of native coronary artery without angina pectoris: Secondary | ICD-10-CM | POA: Diagnosis not present

## 2017-08-12 DIAGNOSIS — I4891 Unspecified atrial fibrillation: Secondary | ICD-10-CM | POA: Diagnosis not present

## 2017-08-12 DIAGNOSIS — R1311 Dysphagia, oral phase: Secondary | ICD-10-CM | POA: Diagnosis not present

## 2017-08-12 DIAGNOSIS — E118 Type 2 diabetes mellitus with unspecified complications: Secondary | ICD-10-CM | POA: Diagnosis not present

## 2017-08-12 DIAGNOSIS — Z452 Encounter for adjustment and management of vascular access device: Secondary | ICD-10-CM | POA: Diagnosis not present

## 2017-08-12 DIAGNOSIS — J9600 Acute respiratory failure, unspecified whether with hypoxia or hypercapnia: Secondary | ICD-10-CM | POA: Diagnosis not present

## 2017-08-12 DIAGNOSIS — J9811 Atelectasis: Secondary | ICD-10-CM | POA: Diagnosis not present

## 2017-08-12 DIAGNOSIS — I739 Peripheral vascular disease, unspecified: Secondary | ICD-10-CM | POA: Diagnosis not present

## 2017-08-12 DIAGNOSIS — I10 Essential (primary) hypertension: Secondary | ICD-10-CM | POA: Diagnosis not present

## 2017-08-12 DIAGNOSIS — Z430 Encounter for attention to tracheostomy: Secondary | ICD-10-CM | POA: Diagnosis not present

## 2017-08-13 DIAGNOSIS — I10 Essential (primary) hypertension: Secondary | ICD-10-CM | POA: Diagnosis not present

## 2017-08-13 DIAGNOSIS — I739 Peripheral vascular disease, unspecified: Secondary | ICD-10-CM | POA: Diagnosis not present

## 2017-08-13 DIAGNOSIS — I2510 Atherosclerotic heart disease of native coronary artery without angina pectoris: Secondary | ICD-10-CM | POA: Diagnosis not present

## 2017-08-13 DIAGNOSIS — J9601 Acute respiratory failure with hypoxia: Secondary | ICD-10-CM | POA: Diagnosis not present

## 2017-08-13 DIAGNOSIS — N183 Chronic kidney disease, stage 3 (moderate): Secondary | ICD-10-CM | POA: Diagnosis not present

## 2017-08-13 DIAGNOSIS — J811 Chronic pulmonary edema: Secondary | ICD-10-CM | POA: Diagnosis not present

## 2017-08-13 DIAGNOSIS — I5020 Unspecified systolic (congestive) heart failure: Secondary | ICD-10-CM | POA: Diagnosis not present

## 2017-08-13 DIAGNOSIS — J90 Pleural effusion, not elsewhere classified: Secondary | ICD-10-CM | POA: Diagnosis not present

## 2017-08-13 DIAGNOSIS — I5021 Acute systolic (congestive) heart failure: Secondary | ICD-10-CM | POA: Diagnosis not present

## 2017-08-13 DIAGNOSIS — R1311 Dysphagia, oral phase: Secondary | ICD-10-CM | POA: Diagnosis not present

## 2017-08-14 DIAGNOSIS — I517 Cardiomegaly: Secondary | ICD-10-CM | POA: Diagnosis not present

## 2017-08-14 DIAGNOSIS — J90 Pleural effusion, not elsewhere classified: Secondary | ICD-10-CM | POA: Diagnosis not present

## 2017-08-14 DIAGNOSIS — J9601 Acute respiratory failure with hypoxia: Secondary | ICD-10-CM | POA: Diagnosis not present

## 2017-08-14 DIAGNOSIS — Z7409 Other reduced mobility: Secondary | ICD-10-CM | POA: Diagnosis not present

## 2017-08-14 DIAGNOSIS — R5381 Other malaise: Secondary | ICD-10-CM | POA: Diagnosis not present

## 2017-08-14 DIAGNOSIS — R1311 Dysphagia, oral phase: Secondary | ICD-10-CM | POA: Diagnosis not present

## 2017-08-14 DIAGNOSIS — N183 Chronic kidney disease, stage 3 (moderate): Secondary | ICD-10-CM | POA: Diagnosis not present

## 2017-08-14 DIAGNOSIS — I10 Essential (primary) hypertension: Secondary | ICD-10-CM | POA: Diagnosis not present

## 2017-08-14 DIAGNOSIS — I2510 Atherosclerotic heart disease of native coronary artery without angina pectoris: Secondary | ICD-10-CM | POA: Diagnosis not present

## 2017-08-14 DIAGNOSIS — I4891 Unspecified atrial fibrillation: Secondary | ICD-10-CM | POA: Diagnosis not present

## 2017-08-14 DIAGNOSIS — Z954 Presence of other heart-valve replacement: Secondary | ICD-10-CM | POA: Diagnosis not present

## 2017-08-14 DIAGNOSIS — I5020 Unspecified systolic (congestive) heart failure: Secondary | ICD-10-CM | POA: Diagnosis not present

## 2017-08-14 DIAGNOSIS — I5021 Acute systolic (congestive) heart failure: Secondary | ICD-10-CM | POA: Diagnosis not present

## 2017-08-14 DIAGNOSIS — R269 Unspecified abnormalities of gait and mobility: Secondary | ICD-10-CM | POA: Diagnosis not present

## 2017-08-14 DIAGNOSIS — I739 Peripheral vascular disease, unspecified: Secondary | ICD-10-CM | POA: Diagnosis not present

## 2017-08-15 DIAGNOSIS — R1311 Dysphagia, oral phase: Secondary | ICD-10-CM | POA: Diagnosis not present

## 2017-08-15 DIAGNOSIS — N183 Chronic kidney disease, stage 3 (moderate): Secondary | ICD-10-CM | POA: Diagnosis not present

## 2017-08-15 DIAGNOSIS — I5021 Acute systolic (congestive) heart failure: Secondary | ICD-10-CM | POA: Diagnosis not present

## 2017-08-15 DIAGNOSIS — I4891 Unspecified atrial fibrillation: Secondary | ICD-10-CM | POA: Diagnosis not present

## 2017-08-15 DIAGNOSIS — I2510 Atherosclerotic heart disease of native coronary artery without angina pectoris: Secondary | ICD-10-CM | POA: Diagnosis not present

## 2017-08-15 DIAGNOSIS — J90 Pleural effusion, not elsewhere classified: Secondary | ICD-10-CM | POA: Diagnosis not present

## 2017-08-15 DIAGNOSIS — I517 Cardiomegaly: Secondary | ICD-10-CM | POA: Diagnosis not present

## 2017-08-15 DIAGNOSIS — J9601 Acute respiratory failure with hypoxia: Secondary | ICD-10-CM | POA: Diagnosis not present

## 2017-08-15 DIAGNOSIS — I739 Peripheral vascular disease, unspecified: Secondary | ICD-10-CM | POA: Diagnosis not present

## 2017-08-15 DIAGNOSIS — I10 Essential (primary) hypertension: Secondary | ICD-10-CM | POA: Diagnosis not present

## 2017-08-15 DIAGNOSIS — I5020 Unspecified systolic (congestive) heart failure: Secondary | ICD-10-CM | POA: Diagnosis not present

## 2017-08-15 DIAGNOSIS — Z954 Presence of other heart-valve replacement: Secondary | ICD-10-CM | POA: Diagnosis not present

## 2017-08-16 DIAGNOSIS — J90 Pleural effusion, not elsewhere classified: Secondary | ICD-10-CM | POA: Diagnosis not present

## 2017-08-16 DIAGNOSIS — N183 Chronic kidney disease, stage 3 (moderate): Secondary | ICD-10-CM | POA: Diagnosis not present

## 2017-08-16 DIAGNOSIS — I517 Cardiomegaly: Secondary | ICD-10-CM | POA: Diagnosis not present

## 2017-08-16 DIAGNOSIS — Z954 Presence of other heart-valve replacement: Secondary | ICD-10-CM | POA: Diagnosis not present

## 2017-08-16 DIAGNOSIS — I4891 Unspecified atrial fibrillation: Secondary | ICD-10-CM | POA: Diagnosis not present

## 2017-08-16 DIAGNOSIS — I2510 Atherosclerotic heart disease of native coronary artery without angina pectoris: Secondary | ICD-10-CM | POA: Diagnosis not present

## 2017-08-16 DIAGNOSIS — J9601 Acute respiratory failure with hypoxia: Secondary | ICD-10-CM | POA: Diagnosis not present

## 2017-08-16 DIAGNOSIS — I10 Essential (primary) hypertension: Secondary | ICD-10-CM | POA: Diagnosis not present

## 2017-08-16 DIAGNOSIS — R1311 Dysphagia, oral phase: Secondary | ICD-10-CM | POA: Diagnosis not present

## 2017-08-16 DIAGNOSIS — I739 Peripheral vascular disease, unspecified: Secondary | ICD-10-CM | POA: Diagnosis not present

## 2017-08-16 DIAGNOSIS — I5021 Acute systolic (congestive) heart failure: Secondary | ICD-10-CM | POA: Diagnosis not present

## 2017-08-16 DIAGNOSIS — I5020 Unspecified systolic (congestive) heart failure: Secondary | ICD-10-CM | POA: Diagnosis not present

## 2017-08-17 DIAGNOSIS — I739 Peripheral vascular disease, unspecified: Secondary | ICD-10-CM | POA: Diagnosis not present

## 2017-08-17 DIAGNOSIS — I10 Essential (primary) hypertension: Secondary | ICD-10-CM | POA: Diagnosis not present

## 2017-08-17 DIAGNOSIS — I517 Cardiomegaly: Secondary | ICD-10-CM | POA: Diagnosis not present

## 2017-08-17 DIAGNOSIS — I4891 Unspecified atrial fibrillation: Secondary | ICD-10-CM | POA: Diagnosis not present

## 2017-08-17 DIAGNOSIS — N183 Chronic kidney disease, stage 3 (moderate): Secondary | ICD-10-CM | POA: Diagnosis not present

## 2017-08-17 DIAGNOSIS — J9601 Acute respiratory failure with hypoxia: Secondary | ICD-10-CM | POA: Diagnosis not present

## 2017-08-17 DIAGNOSIS — I5020 Unspecified systolic (congestive) heart failure: Secondary | ICD-10-CM | POA: Diagnosis not present

## 2017-08-17 DIAGNOSIS — I5021 Acute systolic (congestive) heart failure: Secondary | ICD-10-CM | POA: Diagnosis not present

## 2017-08-17 DIAGNOSIS — I2510 Atherosclerotic heart disease of native coronary artery without angina pectoris: Secondary | ICD-10-CM | POA: Diagnosis not present

## 2017-08-17 DIAGNOSIS — R1311 Dysphagia, oral phase: Secondary | ICD-10-CM | POA: Diagnosis not present

## 2017-08-17 DIAGNOSIS — I459 Conduction disorder, unspecified: Secondary | ICD-10-CM | POA: Diagnosis not present

## 2017-08-18 DIAGNOSIS — E118 Type 2 diabetes mellitus with unspecified complications: Secondary | ICD-10-CM | POA: Diagnosis not present

## 2017-08-18 DIAGNOSIS — R69 Illness, unspecified: Secondary | ICD-10-CM | POA: Diagnosis not present

## 2017-08-18 DIAGNOSIS — I5020 Unspecified systolic (congestive) heart failure: Secondary | ICD-10-CM | POA: Diagnosis not present

## 2017-08-18 DIAGNOSIS — Z430 Encounter for attention to tracheostomy: Secondary | ICD-10-CM | POA: Diagnosis not present

## 2017-08-18 DIAGNOSIS — I10 Essential (primary) hypertension: Secondary | ICD-10-CM | POA: Diagnosis not present

## 2017-08-18 DIAGNOSIS — J209 Acute bronchitis, unspecified: Secondary | ICD-10-CM | POA: Diagnosis not present

## 2017-08-18 DIAGNOSIS — I459 Conduction disorder, unspecified: Secondary | ICD-10-CM | POA: Diagnosis not present

## 2017-08-18 DIAGNOSIS — J9811 Atelectasis: Secondary | ICD-10-CM | POA: Diagnosis not present

## 2017-08-18 DIAGNOSIS — R1311 Dysphagia, oral phase: Secondary | ICD-10-CM | POA: Diagnosis not present

## 2017-08-18 DIAGNOSIS — F0391 Unspecified dementia with behavioral disturbance: Secondary | ICD-10-CM | POA: Diagnosis not present

## 2017-08-18 DIAGNOSIS — J9600 Acute respiratory failure, unspecified whether with hypoxia or hypercapnia: Secondary | ICD-10-CM | POA: Diagnosis not present

## 2017-08-18 DIAGNOSIS — I739 Peripheral vascular disease, unspecified: Secondary | ICD-10-CM | POA: Diagnosis not present

## 2017-08-18 DIAGNOSIS — I4891 Unspecified atrial fibrillation: Secondary | ICD-10-CM | POA: Diagnosis not present

## 2017-08-18 DIAGNOSIS — I5021 Acute systolic (congestive) heart failure: Secondary | ICD-10-CM | POA: Diagnosis not present

## 2017-08-18 DIAGNOSIS — I2510 Atherosclerotic heart disease of native coronary artery without angina pectoris: Secondary | ICD-10-CM | POA: Diagnosis not present

## 2017-08-18 DIAGNOSIS — N183 Chronic kidney disease, stage 3 (moderate): Secondary | ICD-10-CM | POA: Diagnosis not present

## 2017-08-18 DIAGNOSIS — J986 Disorders of diaphragm: Secondary | ICD-10-CM | POA: Diagnosis not present

## 2017-08-19 DIAGNOSIS — I5023 Acute on chronic systolic (congestive) heart failure: Secondary | ICD-10-CM | POA: Diagnosis not present

## 2017-08-19 DIAGNOSIS — Z4682 Encounter for fitting and adjustment of non-vascular catheter: Secondary | ICD-10-CM | POA: Diagnosis not present

## 2017-08-19 DIAGNOSIS — J95821 Acute postprocedural respiratory failure: Secondary | ICD-10-CM | POA: Diagnosis not present

## 2017-08-19 DIAGNOSIS — R69 Illness, unspecified: Secondary | ICD-10-CM | POA: Diagnosis not present

## 2017-08-19 DIAGNOSIS — I5020 Unspecified systolic (congestive) heart failure: Secondary | ICD-10-CM | POA: Diagnosis not present

## 2017-08-19 DIAGNOSIS — R1311 Dysphagia, oral phase: Secondary | ICD-10-CM | POA: Diagnosis not present

## 2017-08-19 DIAGNOSIS — I4891 Unspecified atrial fibrillation: Secondary | ICD-10-CM | POA: Diagnosis not present

## 2017-08-19 DIAGNOSIS — I739 Peripheral vascular disease, unspecified: Secondary | ICD-10-CM | POA: Diagnosis not present

## 2017-08-19 DIAGNOSIS — I2510 Atherosclerotic heart disease of native coronary artery without angina pectoris: Secondary | ICD-10-CM | POA: Diagnosis not present

## 2017-08-19 DIAGNOSIS — I5021 Acute systolic (congestive) heart failure: Secondary | ICD-10-CM | POA: Diagnosis not present

## 2017-08-19 DIAGNOSIS — J90 Pleural effusion, not elsewhere classified: Secondary | ICD-10-CM | POA: Diagnosis not present

## 2017-08-19 DIAGNOSIS — I10 Essential (primary) hypertension: Secondary | ICD-10-CM | POA: Diagnosis not present

## 2017-08-19 DIAGNOSIS — Z930 Tracheostomy status: Secondary | ICD-10-CM | POA: Diagnosis not present

## 2017-08-19 DIAGNOSIS — N183 Chronic kidney disease, stage 3 (moderate): Secondary | ICD-10-CM | POA: Diagnosis not present

## 2017-08-19 DIAGNOSIS — I459 Conduction disorder, unspecified: Secondary | ICD-10-CM | POA: Diagnosis not present

## 2017-08-19 DIAGNOSIS — J150 Pneumonia due to Klebsiella pneumoniae: Secondary | ICD-10-CM | POA: Diagnosis not present

## 2017-08-19 DIAGNOSIS — E1165 Type 2 diabetes mellitus with hyperglycemia: Secondary | ICD-10-CM | POA: Diagnosis not present

## 2017-08-20 DIAGNOSIS — I739 Peripheral vascular disease, unspecified: Secondary | ICD-10-CM | POA: Diagnosis not present

## 2017-08-20 DIAGNOSIS — I5020 Unspecified systolic (congestive) heart failure: Secondary | ICD-10-CM | POA: Diagnosis not present

## 2017-08-20 DIAGNOSIS — J90 Pleural effusion, not elsewhere classified: Secondary | ICD-10-CM | POA: Diagnosis not present

## 2017-08-20 DIAGNOSIS — I2510 Atherosclerotic heart disease of native coronary artery without angina pectoris: Secondary | ICD-10-CM | POA: Diagnosis not present

## 2017-08-20 DIAGNOSIS — I4891 Unspecified atrial fibrillation: Secondary | ICD-10-CM | POA: Diagnosis not present

## 2017-08-20 DIAGNOSIS — I5021 Acute systolic (congestive) heart failure: Secondary | ICD-10-CM | POA: Diagnosis not present

## 2017-08-20 DIAGNOSIS — I459 Conduction disorder, unspecified: Secondary | ICD-10-CM | POA: Diagnosis not present

## 2017-08-20 DIAGNOSIS — N179 Acute kidney failure, unspecified: Secondary | ICD-10-CM | POA: Diagnosis not present

## 2017-08-20 DIAGNOSIS — Z9911 Dependence on respirator [ventilator] status: Secondary | ICD-10-CM | POA: Diagnosis not present

## 2017-08-20 DIAGNOSIS — Z136 Encounter for screening for cardiovascular disorders: Secondary | ICD-10-CM | POA: Diagnosis not present

## 2017-08-20 DIAGNOSIS — I10 Essential (primary) hypertension: Secondary | ICD-10-CM | POA: Diagnosis not present

## 2017-08-20 DIAGNOSIS — J9610 Chronic respiratory failure, unspecified whether with hypoxia or hypercapnia: Secondary | ICD-10-CM | POA: Diagnosis not present

## 2017-08-20 DIAGNOSIS — I5023 Acute on chronic systolic (congestive) heart failure: Secondary | ICD-10-CM | POA: Diagnosis not present

## 2017-08-20 DIAGNOSIS — R69 Illness, unspecified: Secondary | ICD-10-CM | POA: Diagnosis not present

## 2017-08-20 DIAGNOSIS — Z954 Presence of other heart-valve replacement: Secondary | ICD-10-CM | POA: Diagnosis not present

## 2017-08-21 DIAGNOSIS — I5021 Acute systolic (congestive) heart failure: Secondary | ICD-10-CM | POA: Diagnosis not present

## 2017-08-21 DIAGNOSIS — I4891 Unspecified atrial fibrillation: Secondary | ICD-10-CM | POA: Diagnosis not present

## 2017-08-21 DIAGNOSIS — J9610 Chronic respiratory failure, unspecified whether with hypoxia or hypercapnia: Secondary | ICD-10-CM | POA: Diagnosis not present

## 2017-08-21 DIAGNOSIS — D500 Iron deficiency anemia secondary to blood loss (chronic): Secondary | ICD-10-CM | POA: Diagnosis not present

## 2017-08-21 DIAGNOSIS — Z930 Tracheostomy status: Secondary | ICD-10-CM | POA: Diagnosis not present

## 2017-08-21 DIAGNOSIS — I2510 Atherosclerotic heart disease of native coronary artery without angina pectoris: Secondary | ICD-10-CM | POA: Diagnosis not present

## 2017-08-21 DIAGNOSIS — I5023 Acute on chronic systolic (congestive) heart failure: Secondary | ICD-10-CM | POA: Diagnosis not present

## 2017-08-21 DIAGNOSIS — N183 Chronic kidney disease, stage 3 (moderate): Secondary | ICD-10-CM | POA: Diagnosis not present

## 2017-08-21 DIAGNOSIS — I10 Essential (primary) hypertension: Secondary | ICD-10-CM | POA: Diagnosis not present

## 2017-08-21 DIAGNOSIS — K922 Gastrointestinal hemorrhage, unspecified: Secondary | ICD-10-CM | POA: Diagnosis not present

## 2017-08-21 DIAGNOSIS — Z9911 Dependence on respirator [ventilator] status: Secondary | ICD-10-CM | POA: Diagnosis not present

## 2017-08-21 DIAGNOSIS — I517 Cardiomegaly: Secondary | ICD-10-CM | POA: Diagnosis not present

## 2017-08-21 DIAGNOSIS — I739 Peripheral vascular disease, unspecified: Secondary | ICD-10-CM | POA: Diagnosis not present

## 2017-08-21 DIAGNOSIS — R918 Other nonspecific abnormal finding of lung field: Secondary | ICD-10-CM | POA: Diagnosis not present

## 2017-08-21 DIAGNOSIS — I5020 Unspecified systolic (congestive) heart failure: Secondary | ICD-10-CM | POA: Diagnosis not present

## 2017-08-21 DIAGNOSIS — R69 Illness, unspecified: Secondary | ICD-10-CM | POA: Diagnosis not present

## 2017-08-21 DIAGNOSIS — N179 Acute kidney failure, unspecified: Secondary | ICD-10-CM | POA: Diagnosis not present

## 2017-08-22 DIAGNOSIS — K922 Gastrointestinal hemorrhage, unspecified: Secondary | ICD-10-CM | POA: Diagnosis not present

## 2017-08-22 DIAGNOSIS — I5020 Unspecified systolic (congestive) heart failure: Secondary | ICD-10-CM | POA: Diagnosis not present

## 2017-08-22 DIAGNOSIS — I459 Conduction disorder, unspecified: Secondary | ICD-10-CM | POA: Diagnosis not present

## 2017-08-22 DIAGNOSIS — I10 Essential (primary) hypertension: Secondary | ICD-10-CM | POA: Diagnosis not present

## 2017-08-22 DIAGNOSIS — I5023 Acute on chronic systolic (congestive) heart failure: Secondary | ICD-10-CM | POA: Diagnosis not present

## 2017-08-22 DIAGNOSIS — I4891 Unspecified atrial fibrillation: Secondary | ICD-10-CM | POA: Diagnosis not present

## 2017-08-22 DIAGNOSIS — Z9911 Dependence on respirator [ventilator] status: Secondary | ICD-10-CM | POA: Diagnosis not present

## 2017-08-22 DIAGNOSIS — J9610 Chronic respiratory failure, unspecified whether with hypoxia or hypercapnia: Secondary | ICD-10-CM | POA: Diagnosis not present

## 2017-08-22 DIAGNOSIS — I739 Peripheral vascular disease, unspecified: Secondary | ICD-10-CM | POA: Diagnosis not present

## 2017-08-22 DIAGNOSIS — N179 Acute kidney failure, unspecified: Secondary | ICD-10-CM | POA: Diagnosis not present

## 2017-08-22 DIAGNOSIS — I342 Nonrheumatic mitral (valve) stenosis: Secondary | ICD-10-CM | POA: Diagnosis not present

## 2017-08-22 DIAGNOSIS — J90 Pleural effusion, not elsewhere classified: Secondary | ICD-10-CM | POA: Diagnosis not present

## 2017-08-22 DIAGNOSIS — I2510 Atherosclerotic heart disease of native coronary artery without angina pectoris: Secondary | ICD-10-CM | POA: Diagnosis not present

## 2017-08-22 DIAGNOSIS — I5021 Acute systolic (congestive) heart failure: Secondary | ICD-10-CM | POA: Diagnosis not present

## 2017-08-22 DIAGNOSIS — J9690 Respiratory failure, unspecified, unspecified whether with hypoxia or hypercapnia: Secondary | ICD-10-CM | POA: Diagnosis not present

## 2017-08-22 DIAGNOSIS — R69 Illness, unspecified: Secondary | ICD-10-CM | POA: Diagnosis not present

## 2017-08-22 DIAGNOSIS — D500 Iron deficiency anemia secondary to blood loss (chronic): Secondary | ICD-10-CM | POA: Diagnosis not present

## 2017-08-22 DIAGNOSIS — I517 Cardiomegaly: Secondary | ICD-10-CM | POA: Diagnosis not present

## 2017-08-22 DIAGNOSIS — Z930 Tracheostomy status: Secondary | ICD-10-CM | POA: Diagnosis not present

## 2017-08-22 DIAGNOSIS — Z48812 Encounter for surgical aftercare following surgery on the circulatory system: Secondary | ICD-10-CM | POA: Diagnosis not present

## 2017-08-22 DIAGNOSIS — R4182 Altered mental status, unspecified: Secondary | ICD-10-CM | POA: Diagnosis not present

## 2017-08-23 DIAGNOSIS — J9690 Respiratory failure, unspecified, unspecified whether with hypoxia or hypercapnia: Secondary | ICD-10-CM | POA: Diagnosis not present

## 2017-08-23 DIAGNOSIS — E8770 Fluid overload, unspecified: Secondary | ICD-10-CM | POA: Diagnosis not present

## 2017-08-23 DIAGNOSIS — I10 Essential (primary) hypertension: Secondary | ICD-10-CM | POA: Diagnosis not present

## 2017-08-23 DIAGNOSIS — I2510 Atherosclerotic heart disease of native coronary artery without angina pectoris: Secondary | ICD-10-CM | POA: Diagnosis not present

## 2017-08-23 DIAGNOSIS — Z48812 Encounter for surgical aftercare following surgery on the circulatory system: Secondary | ICD-10-CM | POA: Diagnosis not present

## 2017-08-23 DIAGNOSIS — K922 Gastrointestinal hemorrhage, unspecified: Secondary | ICD-10-CM | POA: Diagnosis not present

## 2017-08-23 DIAGNOSIS — I5020 Unspecified systolic (congestive) heart failure: Secondary | ICD-10-CM | POA: Diagnosis not present

## 2017-08-23 DIAGNOSIS — I517 Cardiomegaly: Secondary | ICD-10-CM | POA: Diagnosis not present

## 2017-08-23 DIAGNOSIS — I459 Conduction disorder, unspecified: Secondary | ICD-10-CM | POA: Diagnosis not present

## 2017-08-23 DIAGNOSIS — Z930 Tracheostomy status: Secondary | ICD-10-CM | POA: Diagnosis not present

## 2017-08-23 DIAGNOSIS — I4891 Unspecified atrial fibrillation: Secondary | ICD-10-CM | POA: Diagnosis not present

## 2017-08-23 DIAGNOSIS — I5023 Acute on chronic systolic (congestive) heart failure: Secondary | ICD-10-CM | POA: Diagnosis not present

## 2017-08-23 DIAGNOSIS — J9610 Chronic respiratory failure, unspecified whether with hypoxia or hypercapnia: Secondary | ICD-10-CM | POA: Diagnosis not present

## 2017-08-23 DIAGNOSIS — R4182 Altered mental status, unspecified: Secondary | ICD-10-CM | POA: Diagnosis not present

## 2017-08-23 DIAGNOSIS — I342 Nonrheumatic mitral (valve) stenosis: Secondary | ICD-10-CM | POA: Diagnosis not present

## 2017-08-23 DIAGNOSIS — N183 Chronic kidney disease, stage 3 (moderate): Secondary | ICD-10-CM | POA: Diagnosis not present

## 2017-08-23 DIAGNOSIS — R69 Illness, unspecified: Secondary | ICD-10-CM | POA: Diagnosis not present

## 2017-08-23 DIAGNOSIS — J90 Pleural effusion, not elsewhere classified: Secondary | ICD-10-CM | POA: Diagnosis not present

## 2017-08-23 DIAGNOSIS — I5021 Acute systolic (congestive) heart failure: Secondary | ICD-10-CM | POA: Diagnosis not present

## 2017-08-23 DIAGNOSIS — N179 Acute kidney failure, unspecified: Secondary | ICD-10-CM | POA: Diagnosis not present

## 2017-08-23 DIAGNOSIS — I739 Peripheral vascular disease, unspecified: Secondary | ICD-10-CM | POA: Diagnosis not present

## 2017-08-23 DIAGNOSIS — D500 Iron deficiency anemia secondary to blood loss (chronic): Secondary | ICD-10-CM | POA: Diagnosis not present

## 2017-08-23 DIAGNOSIS — Z9911 Dependence on respirator [ventilator] status: Secondary | ICD-10-CM | POA: Diagnosis not present

## 2017-08-24 DIAGNOSIS — I2510 Atherosclerotic heart disease of native coronary artery without angina pectoris: Secondary | ICD-10-CM | POA: Diagnosis not present

## 2017-08-24 DIAGNOSIS — J9601 Acute respiratory failure with hypoxia: Secondary | ICD-10-CM | POA: Diagnosis not present

## 2017-08-24 DIAGNOSIS — N183 Chronic kidney disease, stage 3 (moderate): Secondary | ICD-10-CM | POA: Diagnosis not present

## 2017-08-24 DIAGNOSIS — I342 Nonrheumatic mitral (valve) stenosis: Secondary | ICD-10-CM | POA: Diagnosis not present

## 2017-08-24 DIAGNOSIS — R4182 Altered mental status, unspecified: Secondary | ICD-10-CM | POA: Diagnosis not present

## 2017-08-24 DIAGNOSIS — I517 Cardiomegaly: Secondary | ICD-10-CM | POA: Diagnosis not present

## 2017-08-24 DIAGNOSIS — I5020 Unspecified systolic (congestive) heart failure: Secondary | ICD-10-CM | POA: Diagnosis not present

## 2017-08-24 DIAGNOSIS — Z136 Encounter for screening for cardiovascular disorders: Secondary | ICD-10-CM | POA: Diagnosis not present

## 2017-08-24 DIAGNOSIS — I739 Peripheral vascular disease, unspecified: Secondary | ICD-10-CM | POA: Diagnosis not present

## 2017-08-24 DIAGNOSIS — I5021 Acute systolic (congestive) heart failure: Secondary | ICD-10-CM | POA: Diagnosis not present

## 2017-08-24 DIAGNOSIS — K922 Gastrointestinal hemorrhage, unspecified: Secondary | ICD-10-CM | POA: Diagnosis not present

## 2017-08-24 DIAGNOSIS — D500 Iron deficiency anemia secondary to blood loss (chronic): Secondary | ICD-10-CM | POA: Diagnosis not present

## 2017-08-24 DIAGNOSIS — Z48812 Encounter for surgical aftercare following surgery on the circulatory system: Secondary | ICD-10-CM | POA: Diagnosis not present

## 2017-08-24 DIAGNOSIS — I10 Essential (primary) hypertension: Secondary | ICD-10-CM | POA: Diagnosis not present

## 2017-08-24 DIAGNOSIS — J9690 Respiratory failure, unspecified, unspecified whether with hypoxia or hypercapnia: Secondary | ICD-10-CM | POA: Diagnosis not present

## 2017-08-24 DIAGNOSIS — J90 Pleural effusion, not elsewhere classified: Secondary | ICD-10-CM | POA: Diagnosis not present

## 2017-08-24 DIAGNOSIS — I4891 Unspecified atrial fibrillation: Secondary | ICD-10-CM | POA: Diagnosis not present

## 2017-08-25 DIAGNOSIS — K922 Gastrointestinal hemorrhage, unspecified: Secondary | ICD-10-CM | POA: Diagnosis not present

## 2017-08-25 DIAGNOSIS — J90 Pleural effusion, not elsewhere classified: Secondary | ICD-10-CM | POA: Diagnosis not present

## 2017-08-25 DIAGNOSIS — I5020 Unspecified systolic (congestive) heart failure: Secondary | ICD-10-CM | POA: Diagnosis not present

## 2017-08-25 DIAGNOSIS — Z48812 Encounter for surgical aftercare following surgery on the circulatory system: Secondary | ICD-10-CM | POA: Diagnosis not present

## 2017-08-25 DIAGNOSIS — I5021 Acute systolic (congestive) heart failure: Secondary | ICD-10-CM | POA: Diagnosis not present

## 2017-08-25 DIAGNOSIS — R4182 Altered mental status, unspecified: Secondary | ICD-10-CM | POA: Diagnosis not present

## 2017-08-25 DIAGNOSIS — I2510 Atherosclerotic heart disease of native coronary artery without angina pectoris: Secondary | ICD-10-CM | POA: Diagnosis not present

## 2017-08-25 DIAGNOSIS — I4891 Unspecified atrial fibrillation: Secondary | ICD-10-CM | POA: Diagnosis not present

## 2017-08-25 DIAGNOSIS — I459 Conduction disorder, unspecified: Secondary | ICD-10-CM | POA: Diagnosis not present

## 2017-08-25 DIAGNOSIS — I342 Nonrheumatic mitral (valve) stenosis: Secondary | ICD-10-CM | POA: Diagnosis not present

## 2017-08-25 DIAGNOSIS — J9601 Acute respiratory failure with hypoxia: Secondary | ICD-10-CM | POA: Diagnosis not present

## 2017-08-25 DIAGNOSIS — I10 Essential (primary) hypertension: Secondary | ICD-10-CM | POA: Diagnosis not present

## 2017-08-25 DIAGNOSIS — N183 Chronic kidney disease, stage 3 (moderate): Secondary | ICD-10-CM | POA: Diagnosis not present

## 2017-08-25 DIAGNOSIS — I517 Cardiomegaly: Secondary | ICD-10-CM | POA: Diagnosis not present

## 2017-08-25 DIAGNOSIS — J9690 Respiratory failure, unspecified, unspecified whether with hypoxia or hypercapnia: Secondary | ICD-10-CM | POA: Diagnosis not present

## 2017-08-25 DIAGNOSIS — I739 Peripheral vascular disease, unspecified: Secondary | ICD-10-CM | POA: Diagnosis not present

## 2017-08-25 DIAGNOSIS — D500 Iron deficiency anemia secondary to blood loss (chronic): Secondary | ICD-10-CM | POA: Diagnosis not present

## 2017-08-26 DIAGNOSIS — J9690 Respiratory failure, unspecified, unspecified whether with hypoxia or hypercapnia: Secondary | ICD-10-CM | POA: Diagnosis not present

## 2017-08-26 DIAGNOSIS — I5020 Unspecified systolic (congestive) heart failure: Secondary | ICD-10-CM | POA: Diagnosis not present

## 2017-08-26 DIAGNOSIS — J9601 Acute respiratory failure with hypoxia: Secondary | ICD-10-CM | POA: Diagnosis not present

## 2017-08-26 DIAGNOSIS — I4891 Unspecified atrial fibrillation: Secondary | ICD-10-CM | POA: Diagnosis not present

## 2017-08-26 DIAGNOSIS — R1084 Generalized abdominal pain: Secondary | ICD-10-CM | POA: Diagnosis not present

## 2017-08-26 DIAGNOSIS — I10 Essential (primary) hypertension: Secondary | ICD-10-CM | POA: Diagnosis not present

## 2017-08-26 DIAGNOSIS — J90 Pleural effusion, not elsewhere classified: Secondary | ICD-10-CM | POA: Diagnosis not present

## 2017-08-26 DIAGNOSIS — I2510 Atherosclerotic heart disease of native coronary artery without angina pectoris: Secondary | ICD-10-CM | POA: Diagnosis not present

## 2017-08-26 DIAGNOSIS — I5021 Acute systolic (congestive) heart failure: Secondary | ICD-10-CM | POA: Diagnosis not present

## 2017-08-26 DIAGNOSIS — I739 Peripheral vascular disease, unspecified: Secondary | ICD-10-CM | POA: Diagnosis not present

## 2017-08-27 DIAGNOSIS — J9690 Respiratory failure, unspecified, unspecified whether with hypoxia or hypercapnia: Secondary | ICD-10-CM | POA: Diagnosis not present

## 2017-08-27 DIAGNOSIS — R1084 Generalized abdominal pain: Secondary | ICD-10-CM | POA: Diagnosis not present

## 2017-08-27 DIAGNOSIS — I2510 Atherosclerotic heart disease of native coronary artery without angina pectoris: Secondary | ICD-10-CM | POA: Diagnosis not present

## 2017-08-27 DIAGNOSIS — I4891 Unspecified atrial fibrillation: Secondary | ICD-10-CM | POA: Diagnosis not present

## 2017-08-27 DIAGNOSIS — I459 Conduction disorder, unspecified: Secondary | ICD-10-CM | POA: Diagnosis not present

## 2017-08-27 DIAGNOSIS — I10 Essential (primary) hypertension: Secondary | ICD-10-CM | POA: Diagnosis not present

## 2017-08-27 DIAGNOSIS — J9601 Acute respiratory failure with hypoxia: Secondary | ICD-10-CM | POA: Diagnosis not present

## 2017-08-27 DIAGNOSIS — J90 Pleural effusion, not elsewhere classified: Secondary | ICD-10-CM | POA: Diagnosis not present

## 2017-08-27 DIAGNOSIS — I5021 Acute systolic (congestive) heart failure: Secondary | ICD-10-CM | POA: Diagnosis not present

## 2017-08-27 DIAGNOSIS — I739 Peripheral vascular disease, unspecified: Secondary | ICD-10-CM | POA: Diagnosis not present

## 2017-08-28 DIAGNOSIS — I459 Conduction disorder, unspecified: Secondary | ICD-10-CM | POA: Diagnosis not present

## 2017-08-28 DIAGNOSIS — I5021 Acute systolic (congestive) heart failure: Secondary | ICD-10-CM | POA: Diagnosis not present

## 2017-08-28 DIAGNOSIS — N183 Chronic kidney disease, stage 3 (moderate): Secondary | ICD-10-CM | POA: Diagnosis not present

## 2017-08-28 DIAGNOSIS — I342 Nonrheumatic mitral (valve) stenosis: Secondary | ICD-10-CM | POA: Diagnosis not present

## 2017-08-28 DIAGNOSIS — I5020 Unspecified systolic (congestive) heart failure: Secondary | ICD-10-CM | POA: Diagnosis not present

## 2017-08-28 DIAGNOSIS — R4182 Altered mental status, unspecified: Secondary | ICD-10-CM | POA: Diagnosis not present

## 2017-08-28 DIAGNOSIS — I739 Peripheral vascular disease, unspecified: Secondary | ICD-10-CM | POA: Diagnosis not present

## 2017-08-28 DIAGNOSIS — J9690 Respiratory failure, unspecified, unspecified whether with hypoxia or hypercapnia: Secondary | ICD-10-CM | POA: Diagnosis not present

## 2017-08-28 DIAGNOSIS — R1311 Dysphagia, oral phase: Secondary | ICD-10-CM | POA: Diagnosis not present

## 2017-08-28 DIAGNOSIS — E119 Type 2 diabetes mellitus without complications: Secondary | ICD-10-CM | POA: Diagnosis not present

## 2017-08-28 DIAGNOSIS — I10 Essential (primary) hypertension: Secondary | ICD-10-CM | POA: Diagnosis not present

## 2017-08-28 DIAGNOSIS — I517 Cardiomegaly: Secondary | ICD-10-CM | POA: Diagnosis not present

## 2017-08-28 DIAGNOSIS — R69 Illness, unspecified: Secondary | ICD-10-CM | POA: Diagnosis not present

## 2017-08-28 DIAGNOSIS — J90 Pleural effusion, not elsewhere classified: Secondary | ICD-10-CM | POA: Diagnosis not present

## 2017-08-28 DIAGNOSIS — I2510 Atherosclerotic heart disease of native coronary artery without angina pectoris: Secondary | ICD-10-CM | POA: Diagnosis not present

## 2017-08-28 DIAGNOSIS — Z48812 Encounter for surgical aftercare following surgery on the circulatory system: Secondary | ICD-10-CM | POA: Diagnosis not present

## 2017-08-28 DIAGNOSIS — I4891 Unspecified atrial fibrillation: Secondary | ICD-10-CM | POA: Diagnosis not present

## 2017-08-28 DIAGNOSIS — Z8673 Personal history of transient ischemic attack (TIA), and cerebral infarction without residual deficits: Secondary | ICD-10-CM | POA: Diagnosis not present

## 2017-08-28 DIAGNOSIS — N189 Chronic kidney disease, unspecified: Secondary | ICD-10-CM | POA: Diagnosis not present

## 2017-08-29 DIAGNOSIS — I739 Peripheral vascular disease, unspecified: Secondary | ICD-10-CM | POA: Diagnosis not present

## 2017-08-29 DIAGNOSIS — I10 Essential (primary) hypertension: Secondary | ICD-10-CM | POA: Diagnosis not present

## 2017-08-29 DIAGNOSIS — N183 Chronic kidney disease, stage 3 (moderate): Secondary | ICD-10-CM | POA: Diagnosis not present

## 2017-08-29 DIAGNOSIS — E119 Type 2 diabetes mellitus without complications: Secondary | ICD-10-CM | POA: Diagnosis not present

## 2017-08-29 DIAGNOSIS — I459 Conduction disorder, unspecified: Secondary | ICD-10-CM | POA: Diagnosis not present

## 2017-08-29 DIAGNOSIS — R69 Illness, unspecified: Secondary | ICD-10-CM | POA: Diagnosis not present

## 2017-08-29 DIAGNOSIS — R1311 Dysphagia, oral phase: Secondary | ICD-10-CM | POA: Diagnosis not present

## 2017-08-29 DIAGNOSIS — I5020 Unspecified systolic (congestive) heart failure: Secondary | ICD-10-CM | POA: Diagnosis not present

## 2017-08-29 DIAGNOSIS — I517 Cardiomegaly: Secondary | ICD-10-CM | POA: Diagnosis not present

## 2017-08-29 DIAGNOSIS — I4891 Unspecified atrial fibrillation: Secondary | ICD-10-CM | POA: Diagnosis not present

## 2017-08-29 DIAGNOSIS — I2510 Atherosclerotic heart disease of native coronary artery without angina pectoris: Secondary | ICD-10-CM | POA: Diagnosis not present

## 2017-08-29 DIAGNOSIS — I5021 Acute systolic (congestive) heart failure: Secondary | ICD-10-CM | POA: Diagnosis not present

## 2017-08-29 DIAGNOSIS — N189 Chronic kidney disease, unspecified: Secondary | ICD-10-CM | POA: Diagnosis not present

## 2017-08-29 DIAGNOSIS — Z950 Presence of cardiac pacemaker: Secondary | ICD-10-CM | POA: Diagnosis not present

## 2017-08-29 DIAGNOSIS — Z8673 Personal history of transient ischemic attack (TIA), and cerebral infarction without residual deficits: Secondary | ICD-10-CM | POA: Diagnosis not present

## 2017-08-30 DIAGNOSIS — I10 Essential (primary) hypertension: Secondary | ICD-10-CM | POA: Diagnosis not present

## 2017-08-30 DIAGNOSIS — I739 Peripheral vascular disease, unspecified: Secondary | ICD-10-CM | POA: Diagnosis not present

## 2017-08-30 DIAGNOSIS — I2510 Atherosclerotic heart disease of native coronary artery without angina pectoris: Secondary | ICD-10-CM | POA: Diagnosis not present

## 2017-08-30 DIAGNOSIS — R0989 Other specified symptoms and signs involving the circulatory and respiratory systems: Secondary | ICD-10-CM | POA: Diagnosis not present

## 2017-08-30 DIAGNOSIS — I5021 Acute systolic (congestive) heart failure: Secondary | ICD-10-CM | POA: Diagnosis not present

## 2017-08-30 DIAGNOSIS — N183 Chronic kidney disease, stage 3 (moderate): Secondary | ICD-10-CM | POA: Diagnosis not present

## 2017-08-30 DIAGNOSIS — R1311 Dysphagia, oral phase: Secondary | ICD-10-CM | POA: Diagnosis not present

## 2017-08-30 DIAGNOSIS — I5020 Unspecified systolic (congestive) heart failure: Secondary | ICD-10-CM | POA: Diagnosis not present

## 2017-08-30 DIAGNOSIS — I4891 Unspecified atrial fibrillation: Secondary | ICD-10-CM | POA: Diagnosis not present

## 2017-08-30 DIAGNOSIS — Z954 Presence of other heart-valve replacement: Secondary | ICD-10-CM | POA: Diagnosis not present

## 2017-08-31 DIAGNOSIS — I5020 Unspecified systolic (congestive) heart failure: Secondary | ICD-10-CM | POA: Diagnosis not present

## 2017-08-31 DIAGNOSIS — I5021 Acute systolic (congestive) heart failure: Secondary | ICD-10-CM | POA: Diagnosis not present

## 2017-08-31 DIAGNOSIS — I10 Essential (primary) hypertension: Secondary | ICD-10-CM | POA: Diagnosis not present

## 2017-08-31 DIAGNOSIS — I2510 Atherosclerotic heart disease of native coronary artery without angina pectoris: Secondary | ICD-10-CM | POA: Diagnosis not present

## 2017-08-31 DIAGNOSIS — R1311 Dysphagia, oral phase: Secondary | ICD-10-CM | POA: Diagnosis not present

## 2017-08-31 DIAGNOSIS — I4891 Unspecified atrial fibrillation: Secondary | ICD-10-CM | POA: Diagnosis not present

## 2017-08-31 DIAGNOSIS — I739 Peripheral vascular disease, unspecified: Secondary | ICD-10-CM | POA: Diagnosis not present

## 2017-08-31 DIAGNOSIS — N183 Chronic kidney disease, stage 3 (moderate): Secondary | ICD-10-CM | POA: Diagnosis not present

## 2017-09-01 DIAGNOSIS — N183 Chronic kidney disease, stage 3 (moderate): Secondary | ICD-10-CM | POA: Diagnosis not present

## 2017-09-01 DIAGNOSIS — I5020 Unspecified systolic (congestive) heart failure: Secondary | ICD-10-CM | POA: Diagnosis not present

## 2017-09-01 DIAGNOSIS — I2510 Atherosclerotic heart disease of native coronary artery without angina pectoris: Secondary | ICD-10-CM | POA: Diagnosis not present

## 2017-09-01 DIAGNOSIS — I739 Peripheral vascular disease, unspecified: Secondary | ICD-10-CM | POA: Diagnosis not present

## 2017-09-01 DIAGNOSIS — I4891 Unspecified atrial fibrillation: Secondary | ICD-10-CM | POA: Diagnosis not present

## 2017-09-01 DIAGNOSIS — R1311 Dysphagia, oral phase: Secondary | ICD-10-CM | POA: Diagnosis not present

## 2017-09-01 DIAGNOSIS — I5021 Acute systolic (congestive) heart failure: Secondary | ICD-10-CM | POA: Diagnosis not present

## 2017-09-01 DIAGNOSIS — I10 Essential (primary) hypertension: Secondary | ICD-10-CM | POA: Diagnosis not present

## 2017-09-02 DIAGNOSIS — R1311 Dysphagia, oral phase: Secondary | ICD-10-CM | POA: Diagnosis not present

## 2017-09-02 DIAGNOSIS — I739 Peripheral vascular disease, unspecified: Secondary | ICD-10-CM | POA: Diagnosis not present

## 2017-09-02 DIAGNOSIS — I2510 Atherosclerotic heart disease of native coronary artery without angina pectoris: Secondary | ICD-10-CM | POA: Diagnosis not present

## 2017-09-02 DIAGNOSIS — I10 Essential (primary) hypertension: Secondary | ICD-10-CM | POA: Diagnosis not present

## 2017-09-02 DIAGNOSIS — N183 Chronic kidney disease, stage 3 (moderate): Secondary | ICD-10-CM | POA: Diagnosis not present

## 2017-09-02 DIAGNOSIS — I5020 Unspecified systolic (congestive) heart failure: Secondary | ICD-10-CM | POA: Diagnosis not present

## 2017-09-02 DIAGNOSIS — I4891 Unspecified atrial fibrillation: Secondary | ICD-10-CM | POA: Diagnosis not present

## 2017-09-02 DIAGNOSIS — I5021 Acute systolic (congestive) heart failure: Secondary | ICD-10-CM | POA: Diagnosis not present

## 2017-09-03 DIAGNOSIS — I5020 Unspecified systolic (congestive) heart failure: Secondary | ICD-10-CM | POA: Diagnosis not present

## 2017-09-03 DIAGNOSIS — I10 Essential (primary) hypertension: Secondary | ICD-10-CM | POA: Diagnosis not present

## 2017-09-03 DIAGNOSIS — I5021 Acute systolic (congestive) heart failure: Secondary | ICD-10-CM | POA: Diagnosis not present

## 2017-09-03 DIAGNOSIS — I4891 Unspecified atrial fibrillation: Secondary | ICD-10-CM | POA: Diagnosis not present

## 2017-09-03 DIAGNOSIS — R1311 Dysphagia, oral phase: Secondary | ICD-10-CM | POA: Diagnosis not present

## 2017-09-03 DIAGNOSIS — I739 Peripheral vascular disease, unspecified: Secondary | ICD-10-CM | POA: Diagnosis not present

## 2017-09-03 DIAGNOSIS — I2510 Atherosclerotic heart disease of native coronary artery without angina pectoris: Secondary | ICD-10-CM | POA: Diagnosis not present

## 2017-09-04 DIAGNOSIS — I5020 Unspecified systolic (congestive) heart failure: Secondary | ICD-10-CM | POA: Diagnosis not present

## 2017-09-04 DIAGNOSIS — I2510 Atherosclerotic heart disease of native coronary artery without angina pectoris: Secondary | ICD-10-CM | POA: Diagnosis not present

## 2017-09-04 DIAGNOSIS — I5021 Acute systolic (congestive) heart failure: Secondary | ICD-10-CM | POA: Diagnosis not present

## 2017-09-04 DIAGNOSIS — R1310 Dysphagia, unspecified: Secondary | ICD-10-CM | POA: Diagnosis not present

## 2017-09-04 DIAGNOSIS — T17300A Unspecified foreign body in larynx causing asphyxiation, initial encounter: Secondary | ICD-10-CM | POA: Diagnosis not present

## 2017-09-04 DIAGNOSIS — I10 Essential (primary) hypertension: Secondary | ICD-10-CM | POA: Diagnosis not present

## 2017-09-04 DIAGNOSIS — I739 Peripheral vascular disease, unspecified: Secondary | ICD-10-CM | POA: Diagnosis not present

## 2017-09-04 DIAGNOSIS — I4891 Unspecified atrial fibrillation: Secondary | ICD-10-CM | POA: Diagnosis not present

## 2017-09-04 DIAGNOSIS — N183 Chronic kidney disease, stage 3 (moderate): Secondary | ICD-10-CM | POA: Diagnosis not present

## 2017-09-04 DIAGNOSIS — R1311 Dysphagia, oral phase: Secondary | ICD-10-CM | POA: Diagnosis not present

## 2017-09-05 DIAGNOSIS — I10 Essential (primary) hypertension: Secondary | ICD-10-CM | POA: Diagnosis not present

## 2017-09-05 DIAGNOSIS — I5020 Unspecified systolic (congestive) heart failure: Secondary | ICD-10-CM | POA: Diagnosis not present

## 2017-09-05 DIAGNOSIS — I2510 Atherosclerotic heart disease of native coronary artery without angina pectoris: Secondary | ICD-10-CM | POA: Diagnosis not present

## 2017-09-05 DIAGNOSIS — I5021 Acute systolic (congestive) heart failure: Secondary | ICD-10-CM | POA: Diagnosis not present

## 2017-09-05 DIAGNOSIS — R1311 Dysphagia, oral phase: Secondary | ICD-10-CM | POA: Diagnosis not present

## 2017-09-05 DIAGNOSIS — I739 Peripheral vascular disease, unspecified: Secondary | ICD-10-CM | POA: Diagnosis not present

## 2017-09-05 DIAGNOSIS — I4891 Unspecified atrial fibrillation: Secondary | ICD-10-CM | POA: Diagnosis not present

## 2017-09-05 DIAGNOSIS — N183 Chronic kidney disease, stage 3 (moderate): Secondary | ICD-10-CM | POA: Diagnosis not present

## 2017-09-06 DIAGNOSIS — I739 Peripheral vascular disease, unspecified: Secondary | ICD-10-CM | POA: Diagnosis not present

## 2017-09-06 DIAGNOSIS — I2510 Atherosclerotic heart disease of native coronary artery without angina pectoris: Secondary | ICD-10-CM | POA: Diagnosis not present

## 2017-09-06 DIAGNOSIS — I4891 Unspecified atrial fibrillation: Secondary | ICD-10-CM | POA: Diagnosis not present

## 2017-09-06 DIAGNOSIS — I10 Essential (primary) hypertension: Secondary | ICD-10-CM | POA: Diagnosis not present

## 2017-09-06 DIAGNOSIS — R1311 Dysphagia, oral phase: Secondary | ICD-10-CM | POA: Diagnosis not present

## 2017-09-06 DIAGNOSIS — I5021 Acute systolic (congestive) heart failure: Secondary | ICD-10-CM | POA: Diagnosis not present

## 2017-09-06 DIAGNOSIS — I5020 Unspecified systolic (congestive) heart failure: Secondary | ICD-10-CM | POA: Diagnosis not present

## 2017-09-06 DIAGNOSIS — N183 Chronic kidney disease, stage 3 (moderate): Secondary | ICD-10-CM | POA: Diagnosis not present

## 2017-09-07 DIAGNOSIS — R1311 Dysphagia, oral phase: Secondary | ICD-10-CM | POA: Diagnosis not present

## 2017-09-07 DIAGNOSIS — I5021 Acute systolic (congestive) heart failure: Secondary | ICD-10-CM | POA: Diagnosis not present

## 2017-09-07 DIAGNOSIS — I10 Essential (primary) hypertension: Secondary | ICD-10-CM | POA: Diagnosis not present

## 2017-09-07 DIAGNOSIS — I739 Peripheral vascular disease, unspecified: Secondary | ICD-10-CM | POA: Diagnosis not present

## 2017-09-07 DIAGNOSIS — I2510 Atherosclerotic heart disease of native coronary artery without angina pectoris: Secondary | ICD-10-CM | POA: Diagnosis not present

## 2017-09-07 DIAGNOSIS — I4891 Unspecified atrial fibrillation: Secondary | ICD-10-CM | POA: Diagnosis not present

## 2017-09-07 DIAGNOSIS — I5020 Unspecified systolic (congestive) heart failure: Secondary | ICD-10-CM | POA: Diagnosis not present

## 2017-09-08 DIAGNOSIS — I739 Peripheral vascular disease, unspecified: Secondary | ICD-10-CM | POA: Diagnosis not present

## 2017-09-08 DIAGNOSIS — I4891 Unspecified atrial fibrillation: Secondary | ICD-10-CM | POA: Diagnosis not present

## 2017-09-08 DIAGNOSIS — N183 Chronic kidney disease, stage 3 (moderate): Secondary | ICD-10-CM | POA: Diagnosis not present

## 2017-09-08 DIAGNOSIS — I2510 Atherosclerotic heart disease of native coronary artery without angina pectoris: Secondary | ICD-10-CM | POA: Diagnosis not present

## 2017-09-08 DIAGNOSIS — I10 Essential (primary) hypertension: Secondary | ICD-10-CM | POA: Diagnosis not present

## 2017-09-08 DIAGNOSIS — I5020 Unspecified systolic (congestive) heart failure: Secondary | ICD-10-CM | POA: Diagnosis not present

## 2017-09-08 DIAGNOSIS — I5021 Acute systolic (congestive) heart failure: Secondary | ICD-10-CM | POA: Diagnosis not present

## 2017-09-08 DIAGNOSIS — R1311 Dysphagia, oral phase: Secondary | ICD-10-CM | POA: Diagnosis not present

## 2017-09-09 DIAGNOSIS — I5020 Unspecified systolic (congestive) heart failure: Secondary | ICD-10-CM | POA: Diagnosis not present

## 2017-09-09 DIAGNOSIS — I4891 Unspecified atrial fibrillation: Secondary | ICD-10-CM | POA: Diagnosis not present

## 2017-09-09 DIAGNOSIS — I2510 Atherosclerotic heart disease of native coronary artery without angina pectoris: Secondary | ICD-10-CM | POA: Diagnosis not present

## 2017-09-09 DIAGNOSIS — I5021 Acute systolic (congestive) heart failure: Secondary | ICD-10-CM | POA: Diagnosis not present

## 2017-09-09 DIAGNOSIS — I739 Peripheral vascular disease, unspecified: Secondary | ICD-10-CM | POA: Diagnosis not present

## 2017-09-09 DIAGNOSIS — I10 Essential (primary) hypertension: Secondary | ICD-10-CM | POA: Diagnosis not present

## 2017-09-10 DIAGNOSIS — N183 Chronic kidney disease, stage 3 (moderate): Secondary | ICD-10-CM | POA: Diagnosis not present

## 2017-09-10 DIAGNOSIS — I10 Essential (primary) hypertension: Secondary | ICD-10-CM | POA: Diagnosis not present

## 2017-09-10 DIAGNOSIS — I5021 Acute systolic (congestive) heart failure: Secondary | ICD-10-CM | POA: Diagnosis not present

## 2017-09-10 DIAGNOSIS — I739 Peripheral vascular disease, unspecified: Secondary | ICD-10-CM | POA: Diagnosis not present

## 2017-09-10 DIAGNOSIS — I4891 Unspecified atrial fibrillation: Secondary | ICD-10-CM | POA: Diagnosis not present

## 2017-09-10 DIAGNOSIS — I2510 Atherosclerotic heart disease of native coronary artery without angina pectoris: Secondary | ICD-10-CM | POA: Diagnosis not present

## 2017-09-10 DIAGNOSIS — I5020 Unspecified systolic (congestive) heart failure: Secondary | ICD-10-CM | POA: Diagnosis not present

## 2017-09-11 DIAGNOSIS — I5020 Unspecified systolic (congestive) heart failure: Secondary | ICD-10-CM | POA: Diagnosis not present

## 2017-09-11 DIAGNOSIS — I2510 Atherosclerotic heart disease of native coronary artery without angina pectoris: Secondary | ICD-10-CM | POA: Diagnosis not present

## 2017-09-11 DIAGNOSIS — I10 Essential (primary) hypertension: Secondary | ICD-10-CM | POA: Diagnosis not present

## 2017-09-11 DIAGNOSIS — I739 Peripheral vascular disease, unspecified: Secondary | ICD-10-CM | POA: Diagnosis not present

## 2017-09-11 DIAGNOSIS — N183 Chronic kidney disease, stage 3 (moderate): Secondary | ICD-10-CM | POA: Diagnosis not present

## 2017-09-11 DIAGNOSIS — I4891 Unspecified atrial fibrillation: Secondary | ICD-10-CM | POA: Diagnosis not present

## 2017-09-11 DIAGNOSIS — I5021 Acute systolic (congestive) heart failure: Secondary | ICD-10-CM | POA: Diagnosis not present

## 2017-09-12 DIAGNOSIS — Z452 Encounter for adjustment and management of vascular access device: Secondary | ICD-10-CM | POA: Diagnosis not present

## 2017-09-12 DIAGNOSIS — M2012 Hallux valgus (acquired), left foot: Secondary | ICD-10-CM | POA: Diagnosis not present

## 2017-09-12 DIAGNOSIS — I349 Nonrheumatic mitral valve disorder, unspecified: Secondary | ICD-10-CM | POA: Diagnosis not present

## 2017-09-12 DIAGNOSIS — I5021 Acute systolic (congestive) heart failure: Secondary | ICD-10-CM | POA: Diagnosis not present

## 2017-09-12 DIAGNOSIS — E1142 Type 2 diabetes mellitus with diabetic polyneuropathy: Secondary | ICD-10-CM | POA: Diagnosis not present

## 2017-09-12 DIAGNOSIS — I4891 Unspecified atrial fibrillation: Secondary | ICD-10-CM | POA: Diagnosis not present

## 2017-09-12 DIAGNOSIS — N400 Enlarged prostate without lower urinary tract symptoms: Secondary | ICD-10-CM | POA: Diagnosis not present

## 2017-09-12 DIAGNOSIS — R0789 Other chest pain: Secondary | ICD-10-CM | POA: Diagnosis not present

## 2017-09-12 DIAGNOSIS — J189 Pneumonia, unspecified organism: Secondary | ICD-10-CM | POA: Diagnosis not present

## 2017-09-12 DIAGNOSIS — Z951 Presence of aortocoronary bypass graft: Secondary | ICD-10-CM | POA: Diagnosis not present

## 2017-09-12 DIAGNOSIS — Z8673 Personal history of transient ischemic attack (TIA), and cerebral infarction without residual deficits: Secondary | ICD-10-CM | POA: Diagnosis not present

## 2017-09-12 DIAGNOSIS — R262 Difficulty in walking, not elsewhere classified: Secondary | ICD-10-CM | POA: Diagnosis not present

## 2017-09-12 DIAGNOSIS — C61 Malignant neoplasm of prostate: Secondary | ICD-10-CM | POA: Diagnosis not present

## 2017-09-12 DIAGNOSIS — N183 Chronic kidney disease, stage 3 (moderate): Secondary | ICD-10-CM | POA: Diagnosis not present

## 2017-09-12 DIAGNOSIS — E119 Type 2 diabetes mellitus without complications: Secondary | ICD-10-CM | POA: Diagnosis not present

## 2017-09-12 DIAGNOSIS — Z8719 Personal history of other diseases of the digestive system: Secondary | ICD-10-CM | POA: Diagnosis not present

## 2017-09-12 DIAGNOSIS — E1165 Type 2 diabetes mellitus with hyperglycemia: Secondary | ICD-10-CM | POA: Diagnosis not present

## 2017-09-12 DIAGNOSIS — Z792 Long term (current) use of antibiotics: Secondary | ICD-10-CM | POA: Diagnosis not present

## 2017-09-12 DIAGNOSIS — R41841 Cognitive communication deficit: Secondary | ICD-10-CM | POA: Diagnosis not present

## 2017-09-12 DIAGNOSIS — R4701 Aphasia: Secondary | ICD-10-CM | POA: Diagnosis not present

## 2017-09-12 DIAGNOSIS — M79675 Pain in left toe(s): Secondary | ICD-10-CM | POA: Diagnosis not present

## 2017-09-12 DIAGNOSIS — T17908A Unspecified foreign body in respiratory tract, part unspecified causing other injury, initial encounter: Secondary | ICD-10-CM | POA: Diagnosis not present

## 2017-09-12 DIAGNOSIS — N644 Mastodynia: Secondary | ICD-10-CM | POA: Diagnosis not present

## 2017-09-12 DIAGNOSIS — I2510 Atherosclerotic heart disease of native coronary artery without angina pectoris: Secondary | ICD-10-CM | POA: Diagnosis not present

## 2017-09-12 DIAGNOSIS — I7091 Generalized atherosclerosis: Secondary | ICD-10-CM | POA: Diagnosis not present

## 2017-09-12 DIAGNOSIS — Z930 Tracheostomy status: Secondary | ICD-10-CM | POA: Diagnosis not present

## 2017-09-12 DIAGNOSIS — K9423 Gastrostomy malfunction: Secondary | ICD-10-CM | POA: Diagnosis not present

## 2017-09-12 DIAGNOSIS — Z954 Presence of other heart-valve replacement: Secondary | ICD-10-CM | POA: Diagnosis not present

## 2017-09-12 DIAGNOSIS — E861 Hypovolemia: Secondary | ICD-10-CM | POA: Diagnosis not present

## 2017-09-12 DIAGNOSIS — I69351 Hemiplegia and hemiparesis following cerebral infarction affecting right dominant side: Secondary | ICD-10-CM | POA: Diagnosis not present

## 2017-09-12 DIAGNOSIS — M79674 Pain in right toe(s): Secondary | ICD-10-CM | POA: Diagnosis not present

## 2017-09-12 DIAGNOSIS — I4510 Unspecified right bundle-branch block: Secondary | ICD-10-CM | POA: Diagnosis not present

## 2017-09-12 DIAGNOSIS — M2011 Hallux valgus (acquired), right foot: Secondary | ICD-10-CM | POA: Diagnosis not present

## 2017-09-12 DIAGNOSIS — R1312 Dysphagia, oropharyngeal phase: Secondary | ICD-10-CM | POA: Diagnosis not present

## 2017-09-12 DIAGNOSIS — R69 Illness, unspecified: Secondary | ICD-10-CM | POA: Diagnosis not present

## 2017-09-12 DIAGNOSIS — B351 Tinea unguium: Secondary | ICD-10-CM | POA: Diagnosis not present

## 2017-09-12 DIAGNOSIS — I10 Essential (primary) hypertension: Secondary | ICD-10-CM | POA: Diagnosis not present

## 2017-09-12 DIAGNOSIS — N189 Chronic kidney disease, unspecified: Secondary | ICD-10-CM | POA: Diagnosis not present

## 2017-09-12 DIAGNOSIS — Z23 Encounter for immunization: Secondary | ICD-10-CM | POA: Diagnosis not present

## 2017-09-12 DIAGNOSIS — M6281 Muscle weakness (generalized): Secondary | ICD-10-CM | POA: Diagnosis not present

## 2017-09-12 DIAGNOSIS — I509 Heart failure, unspecified: Secondary | ICD-10-CM | POA: Diagnosis not present

## 2017-09-12 DIAGNOSIS — Z931 Gastrostomy status: Secondary | ICD-10-CM | POA: Diagnosis not present

## 2017-09-12 DIAGNOSIS — I25810 Atherosclerosis of coronary artery bypass graft(s) without angina pectoris: Secondary | ICD-10-CM | POA: Diagnosis not present

## 2017-09-12 DIAGNOSIS — I739 Peripheral vascular disease, unspecified: Secondary | ICD-10-CM | POA: Diagnosis not present

## 2017-09-12 DIAGNOSIS — R1033 Periumbilical pain: Secondary | ICD-10-CM | POA: Diagnosis not present

## 2017-09-13 DIAGNOSIS — I5021 Acute systolic (congestive) heart failure: Secondary | ICD-10-CM | POA: Diagnosis not present

## 2017-09-13 DIAGNOSIS — Z8673 Personal history of transient ischemic attack (TIA), and cerebral infarction without residual deficits: Secondary | ICD-10-CM | POA: Diagnosis not present

## 2017-09-13 DIAGNOSIS — N189 Chronic kidney disease, unspecified: Secondary | ICD-10-CM | POA: Diagnosis not present

## 2017-09-13 DIAGNOSIS — Z931 Gastrostomy status: Secondary | ICD-10-CM | POA: Diagnosis not present

## 2017-09-13 DIAGNOSIS — I10 Essential (primary) hypertension: Secondary | ICD-10-CM | POA: Diagnosis not present

## 2017-09-13 DIAGNOSIS — Z8719 Personal history of other diseases of the digestive system: Secondary | ICD-10-CM | POA: Diagnosis not present

## 2017-09-13 DIAGNOSIS — Z452 Encounter for adjustment and management of vascular access device: Secondary | ICD-10-CM | POA: Diagnosis not present

## 2017-09-13 DIAGNOSIS — T17908A Unspecified foreign body in respiratory tract, part unspecified causing other injury, initial encounter: Secondary | ICD-10-CM | POA: Diagnosis not present

## 2017-09-14 DIAGNOSIS — I509 Heart failure, unspecified: Secondary | ICD-10-CM | POA: Diagnosis not present

## 2017-09-14 DIAGNOSIS — I2510 Atherosclerotic heart disease of native coronary artery without angina pectoris: Secondary | ICD-10-CM | POA: Diagnosis not present

## 2017-09-14 DIAGNOSIS — E1165 Type 2 diabetes mellitus with hyperglycemia: Secondary | ICD-10-CM | POA: Diagnosis not present

## 2017-09-14 DIAGNOSIS — I349 Nonrheumatic mitral valve disorder, unspecified: Secondary | ICD-10-CM | POA: Diagnosis not present

## 2017-09-29 DIAGNOSIS — Z954 Presence of other heart-valve replacement: Secondary | ICD-10-CM | POA: Diagnosis not present

## 2017-09-29 DIAGNOSIS — Z930 Tracheostomy status: Secondary | ICD-10-CM | POA: Diagnosis not present

## 2017-09-29 DIAGNOSIS — I4510 Unspecified right bundle-branch block: Secondary | ICD-10-CM | POA: Diagnosis not present

## 2017-09-29 DIAGNOSIS — Z951 Presence of aortocoronary bypass graft: Secondary | ICD-10-CM | POA: Diagnosis not present

## 2017-10-04 DIAGNOSIS — I10 Essential (primary) hypertension: Secondary | ICD-10-CM | POA: Diagnosis not present

## 2017-10-06 DIAGNOSIS — R0789 Other chest pain: Secondary | ICD-10-CM | POA: Diagnosis not present

## 2017-10-06 DIAGNOSIS — Z931 Gastrostomy status: Secondary | ICD-10-CM | POA: Diagnosis not present

## 2017-10-06 DIAGNOSIS — Z951 Presence of aortocoronary bypass graft: Secondary | ICD-10-CM | POA: Diagnosis not present

## 2017-10-06 DIAGNOSIS — N644 Mastodynia: Secondary | ICD-10-CM | POA: Diagnosis not present

## 2017-10-10 DIAGNOSIS — N644 Mastodynia: Secondary | ICD-10-CM | POA: Diagnosis not present

## 2017-10-10 DIAGNOSIS — Z951 Presence of aortocoronary bypass graft: Secondary | ICD-10-CM | POA: Diagnosis not present

## 2017-10-10 DIAGNOSIS — R0789 Other chest pain: Secondary | ICD-10-CM | POA: Diagnosis not present

## 2017-10-10 DIAGNOSIS — Z931 Gastrostomy status: Secondary | ICD-10-CM | POA: Diagnosis not present

## 2017-10-11 DIAGNOSIS — Z931 Gastrostomy status: Secondary | ICD-10-CM | POA: Diagnosis not present

## 2017-10-11 DIAGNOSIS — Z951 Presence of aortocoronary bypass graft: Secondary | ICD-10-CM | POA: Diagnosis not present

## 2017-10-11 DIAGNOSIS — E119 Type 2 diabetes mellitus without complications: Secondary | ICD-10-CM | POA: Diagnosis not present

## 2017-10-11 DIAGNOSIS — E861 Hypovolemia: Secondary | ICD-10-CM | POA: Diagnosis not present

## 2017-10-11 DIAGNOSIS — I5021 Acute systolic (congestive) heart failure: Secondary | ICD-10-CM | POA: Diagnosis not present

## 2017-10-11 DIAGNOSIS — R0789 Other chest pain: Secondary | ICD-10-CM | POA: Diagnosis not present

## 2017-10-12 DIAGNOSIS — M79674 Pain in right toe(s): Secondary | ICD-10-CM | POA: Diagnosis not present

## 2017-10-12 DIAGNOSIS — I5021 Acute systolic (congestive) heart failure: Secondary | ICD-10-CM | POA: Diagnosis not present

## 2017-10-12 DIAGNOSIS — M2011 Hallux valgus (acquired), right foot: Secondary | ICD-10-CM | POA: Diagnosis not present

## 2017-10-12 DIAGNOSIS — M79675 Pain in left toe(s): Secondary | ICD-10-CM | POA: Diagnosis not present

## 2017-10-12 DIAGNOSIS — Z951 Presence of aortocoronary bypass graft: Secondary | ICD-10-CM | POA: Diagnosis not present

## 2017-10-12 DIAGNOSIS — E861 Hypovolemia: Secondary | ICD-10-CM | POA: Diagnosis not present

## 2017-10-12 DIAGNOSIS — E1142 Type 2 diabetes mellitus with diabetic polyneuropathy: Secondary | ICD-10-CM | POA: Diagnosis not present

## 2017-10-12 DIAGNOSIS — E119 Type 2 diabetes mellitus without complications: Secondary | ICD-10-CM | POA: Diagnosis not present

## 2017-10-12 DIAGNOSIS — Z931 Gastrostomy status: Secondary | ICD-10-CM | POA: Diagnosis not present

## 2017-10-12 DIAGNOSIS — B351 Tinea unguium: Secondary | ICD-10-CM | POA: Diagnosis not present

## 2017-10-12 DIAGNOSIS — M2012 Hallux valgus (acquired), left foot: Secondary | ICD-10-CM | POA: Diagnosis not present

## 2017-10-12 DIAGNOSIS — R0789 Other chest pain: Secondary | ICD-10-CM | POA: Diagnosis not present

## 2017-10-12 DIAGNOSIS — I7091 Generalized atherosclerosis: Secondary | ICD-10-CM | POA: Diagnosis not present

## 2017-10-13 DIAGNOSIS — Z931 Gastrostomy status: Secondary | ICD-10-CM | POA: Diagnosis not present

## 2017-10-13 DIAGNOSIS — E119 Type 2 diabetes mellitus without complications: Secondary | ICD-10-CM | POA: Diagnosis not present

## 2017-10-13 DIAGNOSIS — Z951 Presence of aortocoronary bypass graft: Secondary | ICD-10-CM | POA: Diagnosis not present

## 2017-10-13 DIAGNOSIS — I5021 Acute systolic (congestive) heart failure: Secondary | ICD-10-CM | POA: Diagnosis not present

## 2017-10-13 DIAGNOSIS — E861 Hypovolemia: Secondary | ICD-10-CM | POA: Diagnosis not present

## 2017-10-13 DIAGNOSIS — R0789 Other chest pain: Secondary | ICD-10-CM | POA: Diagnosis not present

## 2017-10-15 DIAGNOSIS — Z931 Gastrostomy status: Secondary | ICD-10-CM | POA: Diagnosis not present

## 2017-10-15 DIAGNOSIS — R1033 Periumbilical pain: Secondary | ICD-10-CM | POA: Diagnosis not present

## 2017-10-16 DIAGNOSIS — Z792 Long term (current) use of antibiotics: Secondary | ICD-10-CM | POA: Diagnosis not present

## 2017-10-16 DIAGNOSIS — Z931 Gastrostomy status: Secondary | ICD-10-CM | POA: Diagnosis not present

## 2017-10-16 DIAGNOSIS — R262 Difficulty in walking, not elsewhere classified: Secondary | ICD-10-CM | POA: Diagnosis not present

## 2017-10-16 DIAGNOSIS — Z951 Presence of aortocoronary bypass graft: Secondary | ICD-10-CM | POA: Diagnosis not present

## 2017-10-16 DIAGNOSIS — K9423 Gastrostomy malfunction: Secondary | ICD-10-CM | POA: Diagnosis not present

## 2017-10-16 DIAGNOSIS — E119 Type 2 diabetes mellitus without complications: Secondary | ICD-10-CM | POA: Diagnosis not present

## 2017-10-18 DIAGNOSIS — Z792 Long term (current) use of antibiotics: Secondary | ICD-10-CM | POA: Diagnosis not present

## 2017-10-18 DIAGNOSIS — R262 Difficulty in walking, not elsewhere classified: Secondary | ICD-10-CM | POA: Diagnosis not present

## 2017-10-18 DIAGNOSIS — Z931 Gastrostomy status: Secondary | ICD-10-CM | POA: Diagnosis not present

## 2017-10-18 DIAGNOSIS — K9423 Gastrostomy malfunction: Secondary | ICD-10-CM | POA: Diagnosis not present

## 2017-10-18 DIAGNOSIS — Z951 Presence of aortocoronary bypass graft: Secondary | ICD-10-CM | POA: Diagnosis not present

## 2017-10-18 DIAGNOSIS — E119 Type 2 diabetes mellitus without complications: Secondary | ICD-10-CM | POA: Diagnosis not present

## 2017-10-20 DIAGNOSIS — Z951 Presence of aortocoronary bypass graft: Secondary | ICD-10-CM | POA: Diagnosis not present

## 2017-10-20 DIAGNOSIS — R262 Difficulty in walking, not elsewhere classified: Secondary | ICD-10-CM | POA: Diagnosis not present

## 2017-10-20 DIAGNOSIS — E119 Type 2 diabetes mellitus without complications: Secondary | ICD-10-CM | POA: Diagnosis not present

## 2017-10-20 DIAGNOSIS — K9423 Gastrostomy malfunction: Secondary | ICD-10-CM | POA: Diagnosis not present

## 2017-10-20 DIAGNOSIS — Z931 Gastrostomy status: Secondary | ICD-10-CM | POA: Diagnosis not present

## 2017-10-20 DIAGNOSIS — Z792 Long term (current) use of antibiotics: Secondary | ICD-10-CM | POA: Diagnosis not present

## 2017-10-23 DIAGNOSIS — N400 Enlarged prostate without lower urinary tract symptoms: Secondary | ICD-10-CM | POA: Diagnosis not present

## 2017-10-23 DIAGNOSIS — J189 Pneumonia, unspecified organism: Secondary | ICD-10-CM | POA: Diagnosis not present

## 2017-10-23 DIAGNOSIS — M6281 Muscle weakness (generalized): Secondary | ICD-10-CM | POA: Diagnosis not present

## 2017-10-23 DIAGNOSIS — I69351 Hemiplegia and hemiparesis following cerebral infarction affecting right dominant side: Secondary | ICD-10-CM | POA: Diagnosis not present

## 2017-10-23 DIAGNOSIS — R41841 Cognitive communication deficit: Secondary | ICD-10-CM | POA: Diagnosis not present

## 2017-10-23 DIAGNOSIS — R1312 Dysphagia, oropharyngeal phase: Secondary | ICD-10-CM | POA: Diagnosis not present

## 2017-10-23 DIAGNOSIS — R4701 Aphasia: Secondary | ICD-10-CM | POA: Diagnosis not present

## 2017-10-23 DIAGNOSIS — C61 Malignant neoplasm of prostate: Secondary | ICD-10-CM | POA: Diagnosis not present

## 2017-10-23 DIAGNOSIS — I25810 Atherosclerosis of coronary artery bypass graft(s) without angina pectoris: Secondary | ICD-10-CM | POA: Diagnosis not present

## 2017-10-25 DIAGNOSIS — I10 Essential (primary) hypertension: Secondary | ICD-10-CM | POA: Diagnosis not present

## 2017-10-25 DIAGNOSIS — Z931 Gastrostomy status: Secondary | ICD-10-CM | POA: Diagnosis not present

## 2017-10-25 DIAGNOSIS — M6281 Muscle weakness (generalized): Secondary | ICD-10-CM | POA: Diagnosis not present

## 2017-10-29 DIAGNOSIS — E785 Hyperlipidemia, unspecified: Secondary | ICD-10-CM | POA: Diagnosis not present

## 2017-10-29 DIAGNOSIS — Z931 Gastrostomy status: Secondary | ICD-10-CM | POA: Diagnosis not present

## 2017-10-29 DIAGNOSIS — I10 Essential (primary) hypertension: Secondary | ICD-10-CM | POA: Diagnosis not present

## 2017-11-01 DIAGNOSIS — E1144 Type 2 diabetes mellitus with diabetic amyotrophy: Secondary | ICD-10-CM | POA: Diagnosis not present

## 2017-11-01 DIAGNOSIS — E119 Type 2 diabetes mellitus without complications: Secondary | ICD-10-CM | POA: Diagnosis not present

## 2017-11-01 DIAGNOSIS — R41841 Cognitive communication deficit: Secondary | ICD-10-CM | POA: Diagnosis not present

## 2017-11-01 DIAGNOSIS — M79606 Pain in leg, unspecified: Secondary | ICD-10-CM | POA: Diagnosis not present

## 2017-11-04 DIAGNOSIS — R69 Illness, unspecified: Secondary | ICD-10-CM | POA: Diagnosis not present

## 2017-11-04 DIAGNOSIS — F329 Major depressive disorder, single episode, unspecified: Secondary | ICD-10-CM | POA: Diagnosis not present

## 2017-11-06 DIAGNOSIS — J189 Pneumonia, unspecified organism: Secondary | ICD-10-CM | POA: Diagnosis not present

## 2017-11-06 DIAGNOSIS — N400 Enlarged prostate without lower urinary tract symptoms: Secondary | ICD-10-CM | POA: Diagnosis not present

## 2017-11-06 DIAGNOSIS — I25810 Atherosclerosis of coronary artery bypass graft(s) without angina pectoris: Secondary | ICD-10-CM | POA: Diagnosis not present

## 2017-11-06 DIAGNOSIS — I69351 Hemiplegia and hemiparesis following cerebral infarction affecting right dominant side: Secondary | ICD-10-CM | POA: Diagnosis not present

## 2017-11-06 DIAGNOSIS — M6281 Muscle weakness (generalized): Secondary | ICD-10-CM | POA: Diagnosis not present

## 2017-11-06 DIAGNOSIS — R41841 Cognitive communication deficit: Secondary | ICD-10-CM | POA: Diagnosis not present

## 2017-11-06 DIAGNOSIS — R1312 Dysphagia, oropharyngeal phase: Secondary | ICD-10-CM | POA: Diagnosis not present

## 2017-11-06 DIAGNOSIS — R4701 Aphasia: Secondary | ICD-10-CM | POA: Diagnosis not present

## 2017-11-07 DIAGNOSIS — R69 Illness, unspecified: Secondary | ICD-10-CM | POA: Diagnosis not present

## 2017-11-07 DIAGNOSIS — R05 Cough: Secondary | ICD-10-CM | POA: Diagnosis not present

## 2017-11-07 DIAGNOSIS — R41841 Cognitive communication deficit: Secondary | ICD-10-CM | POA: Diagnosis not present

## 2017-11-07 DIAGNOSIS — I2510 Atherosclerotic heart disease of native coronary artery without angina pectoris: Secondary | ICD-10-CM | POA: Diagnosis not present

## 2017-11-07 DIAGNOSIS — E119 Type 2 diabetes mellitus without complications: Secondary | ICD-10-CM | POA: Diagnosis not present

## 2017-11-07 DIAGNOSIS — K9422 Gastrostomy infection: Secondary | ICD-10-CM | POA: Diagnosis not present

## 2017-11-07 DIAGNOSIS — M6281 Muscle weakness (generalized): Secondary | ICD-10-CM | POA: Diagnosis not present

## 2017-11-07 DIAGNOSIS — I10 Essential (primary) hypertension: Secondary | ICD-10-CM | POA: Diagnosis not present

## 2017-11-08 DIAGNOSIS — R05 Cough: Secondary | ICD-10-CM | POA: Diagnosis not present

## 2017-11-08 DIAGNOSIS — E119 Type 2 diabetes mellitus without complications: Secondary | ICD-10-CM | POA: Diagnosis not present

## 2017-11-08 DIAGNOSIS — M6281 Muscle weakness (generalized): Secondary | ICD-10-CM | POA: Diagnosis not present

## 2017-11-08 DIAGNOSIS — I10 Essential (primary) hypertension: Secondary | ICD-10-CM | POA: Diagnosis not present

## 2017-11-08 DIAGNOSIS — R41841 Cognitive communication deficit: Secondary | ICD-10-CM | POA: Diagnosis not present

## 2017-11-08 DIAGNOSIS — I2510 Atherosclerotic heart disease of native coronary artery without angina pectoris: Secondary | ICD-10-CM | POA: Diagnosis not present

## 2017-11-08 DIAGNOSIS — K9422 Gastrostomy infection: Secondary | ICD-10-CM | POA: Diagnosis not present

## 2017-11-08 DIAGNOSIS — R69 Illness, unspecified: Secondary | ICD-10-CM | POA: Diagnosis not present

## 2017-11-09 DIAGNOSIS — H401133 Primary open-angle glaucoma, bilateral, severe stage: Secondary | ICD-10-CM | POA: Diagnosis not present

## 2017-11-09 DIAGNOSIS — E119 Type 2 diabetes mellitus without complications: Secondary | ICD-10-CM | POA: Diagnosis not present

## 2017-11-09 DIAGNOSIS — Z961 Presence of intraocular lens: Secondary | ICD-10-CM | POA: Diagnosis not present

## 2017-11-13 DIAGNOSIS — R1310 Dysphagia, unspecified: Secondary | ICD-10-CM | POA: Diagnosis not present

## 2017-11-22 DIAGNOSIS — N189 Chronic kidney disease, unspecified: Secondary | ICD-10-CM | POA: Diagnosis not present

## 2017-11-22 DIAGNOSIS — R1310 Dysphagia, unspecified: Secondary | ICD-10-CM | POA: Diagnosis not present

## 2017-11-22 DIAGNOSIS — R69 Illness, unspecified: Secondary | ICD-10-CM | POA: Diagnosis not present

## 2017-11-22 DIAGNOSIS — I739 Peripheral vascular disease, unspecified: Secondary | ICD-10-CM | POA: Diagnosis not present

## 2017-11-22 DIAGNOSIS — E119 Type 2 diabetes mellitus without complications: Secondary | ICD-10-CM | POA: Diagnosis not present

## 2017-11-22 DIAGNOSIS — Z931 Gastrostomy status: Secondary | ICD-10-CM | POA: Diagnosis not present

## 2017-11-22 DIAGNOSIS — I10 Essential (primary) hypertension: Secondary | ICD-10-CM | POA: Diagnosis not present

## 2017-11-27 DIAGNOSIS — R55 Syncope and collapse: Secondary | ICD-10-CM | POA: Diagnosis not present

## 2017-11-27 DIAGNOSIS — E119 Type 2 diabetes mellitus without complications: Secondary | ICD-10-CM | POA: Diagnosis not present

## 2017-11-27 DIAGNOSIS — R41841 Cognitive communication deficit: Secondary | ICD-10-CM | POA: Diagnosis not present

## 2017-11-27 DIAGNOSIS — I25810 Atherosclerosis of coronary artery bypass graft(s) without angina pectoris: Secondary | ICD-10-CM | POA: Diagnosis not present

## 2017-11-27 DIAGNOSIS — Z931 Gastrostomy status: Secondary | ICD-10-CM | POA: Diagnosis not present

## 2017-11-29 DIAGNOSIS — I10 Essential (primary) hypertension: Secondary | ICD-10-CM | POA: Diagnosis not present

## 2017-11-29 DIAGNOSIS — R634 Abnormal weight loss: Secondary | ICD-10-CM | POA: Diagnosis not present

## 2017-11-29 DIAGNOSIS — B351 Tinea unguium: Secondary | ICD-10-CM | POA: Diagnosis not present

## 2017-11-29 DIAGNOSIS — J449 Chronic obstructive pulmonary disease, unspecified: Secondary | ICD-10-CM | POA: Diagnosis not present

## 2017-11-29 DIAGNOSIS — E785 Hyperlipidemia, unspecified: Secondary | ICD-10-CM | POA: Diagnosis not present

## 2017-11-29 DIAGNOSIS — M6281 Muscle weakness (generalized): Secondary | ICD-10-CM | POA: Diagnosis not present

## 2017-12-04 DIAGNOSIS — R69 Illness, unspecified: Secondary | ICD-10-CM | POA: Diagnosis not present

## 2017-12-04 DIAGNOSIS — I739 Peripheral vascular disease, unspecified: Secondary | ICD-10-CM | POA: Diagnosis not present

## 2017-12-04 DIAGNOSIS — I10 Essential (primary) hypertension: Secondary | ICD-10-CM | POA: Diagnosis not present

## 2017-12-04 DIAGNOSIS — E119 Type 2 diabetes mellitus without complications: Secondary | ICD-10-CM | POA: Diagnosis not present

## 2017-12-04 DIAGNOSIS — N189 Chronic kidney disease, unspecified: Secondary | ICD-10-CM | POA: Diagnosis not present

## 2017-12-04 DIAGNOSIS — Z931 Gastrostomy status: Secondary | ICD-10-CM | POA: Diagnosis not present

## 2017-12-04 DIAGNOSIS — R1310 Dysphagia, unspecified: Secondary | ICD-10-CM | POA: Diagnosis not present

## 2017-12-05 DIAGNOSIS — E1140 Type 2 diabetes mellitus with diabetic neuropathy, unspecified: Secondary | ICD-10-CM | POA: Diagnosis not present

## 2017-12-05 DIAGNOSIS — I5042 Chronic combined systolic (congestive) and diastolic (congestive) heart failure: Secondary | ICD-10-CM | POA: Diagnosis not present

## 2017-12-05 DIAGNOSIS — K2970 Gastritis, unspecified, without bleeding: Secondary | ICD-10-CM | POA: Diagnosis not present

## 2017-12-05 DIAGNOSIS — N183 Chronic kidney disease, stage 3 (moderate): Secondary | ICD-10-CM | POA: Diagnosis not present

## 2017-12-05 DIAGNOSIS — R69 Illness, unspecified: Secondary | ICD-10-CM | POA: Diagnosis not present

## 2017-12-05 DIAGNOSIS — R1310 Dysphagia, unspecified: Secondary | ICD-10-CM | POA: Diagnosis not present

## 2017-12-05 DIAGNOSIS — I2510 Atherosclerotic heart disease of native coronary artery without angina pectoris: Secondary | ICD-10-CM | POA: Diagnosis not present

## 2017-12-05 DIAGNOSIS — K2900 Acute gastritis without bleeding: Secondary | ICD-10-CM | POA: Diagnosis not present

## 2017-12-05 DIAGNOSIS — E785 Hyperlipidemia, unspecified: Secondary | ICD-10-CM | POA: Diagnosis not present

## 2017-12-05 DIAGNOSIS — Z434 Encounter for attention to other artificial openings of digestive tract: Secondary | ICD-10-CM | POA: Diagnosis not present

## 2017-12-05 DIAGNOSIS — R1311 Dysphagia, oral phase: Secondary | ICD-10-CM | POA: Diagnosis not present

## 2017-12-05 DIAGNOSIS — I130 Hypertensive heart and chronic kidney disease with heart failure and stage 1 through stage 4 chronic kidney disease, or unspecified chronic kidney disease: Secondary | ICD-10-CM | POA: Diagnosis not present

## 2017-12-05 DIAGNOSIS — E1151 Type 2 diabetes mellitus with diabetic peripheral angiopathy without gangrene: Secondary | ICD-10-CM | POA: Diagnosis not present

## 2017-12-06 DIAGNOSIS — I10 Essential (primary) hypertension: Secondary | ICD-10-CM | POA: Diagnosis not present

## 2017-12-06 DIAGNOSIS — N189 Chronic kidney disease, unspecified: Secondary | ICD-10-CM | POA: Diagnosis not present

## 2017-12-06 DIAGNOSIS — C61 Malignant neoplasm of prostate: Secondary | ICD-10-CM | POA: Diagnosis not present

## 2017-12-06 DIAGNOSIS — M6281 Muscle weakness (generalized): Secondary | ICD-10-CM | POA: Diagnosis not present

## 2017-12-06 DIAGNOSIS — I69351 Hemiplegia and hemiparesis following cerebral infarction affecting right dominant side: Secondary | ICD-10-CM | POA: Diagnosis not present

## 2017-12-06 DIAGNOSIS — T17908A Unspecified foreign body in respiratory tract, part unspecified causing other injury, initial encounter: Secondary | ICD-10-CM | POA: Diagnosis not present

## 2017-12-06 DIAGNOSIS — Z8719 Personal history of other diseases of the digestive system: Secondary | ICD-10-CM | POA: Diagnosis not present

## 2017-12-06 DIAGNOSIS — Z931 Gastrostomy status: Secondary | ICD-10-CM | POA: Diagnosis not present

## 2017-12-06 DIAGNOSIS — I5021 Acute systolic (congestive) heart failure: Secondary | ICD-10-CM | POA: Diagnosis not present

## 2017-12-06 DIAGNOSIS — Z452 Encounter for adjustment and management of vascular access device: Secondary | ICD-10-CM | POA: Diagnosis not present

## 2017-12-06 DIAGNOSIS — Z8673 Personal history of transient ischemic attack (TIA), and cerebral infarction without residual deficits: Secondary | ICD-10-CM | POA: Diagnosis not present

## 2017-12-08 DIAGNOSIS — J449 Chronic obstructive pulmonary disease, unspecified: Secondary | ICD-10-CM | POA: Diagnosis not present

## 2017-12-08 DIAGNOSIS — M6281 Muscle weakness (generalized): Secondary | ICD-10-CM | POA: Diagnosis not present

## 2017-12-08 DIAGNOSIS — I10 Essential (primary) hypertension: Secondary | ICD-10-CM | POA: Diagnosis not present

## 2017-12-08 DIAGNOSIS — E785 Hyperlipidemia, unspecified: Secondary | ICD-10-CM | POA: Diagnosis not present

## 2017-12-08 DIAGNOSIS — R634 Abnormal weight loss: Secondary | ICD-10-CM | POA: Diagnosis not present

## 2017-12-08 DIAGNOSIS — B351 Tinea unguium: Secondary | ICD-10-CM | POA: Diagnosis not present

## 2017-12-13 DIAGNOSIS — R634 Abnormal weight loss: Secondary | ICD-10-CM | POA: Diagnosis not present

## 2017-12-13 DIAGNOSIS — M6281 Muscle weakness (generalized): Secondary | ICD-10-CM | POA: Diagnosis not present

## 2017-12-13 DIAGNOSIS — I10 Essential (primary) hypertension: Secondary | ICD-10-CM | POA: Diagnosis not present

## 2017-12-13 DIAGNOSIS — M79675 Pain in left toe(s): Secondary | ICD-10-CM | POA: Diagnosis not present

## 2017-12-13 DIAGNOSIS — B351 Tinea unguium: Secondary | ICD-10-CM | POA: Diagnosis not present

## 2017-12-13 DIAGNOSIS — E785 Hyperlipidemia, unspecified: Secondary | ICD-10-CM | POA: Diagnosis not present

## 2017-12-13 DIAGNOSIS — M79674 Pain in right toe(s): Secondary | ICD-10-CM | POA: Diagnosis not present

## 2017-12-13 DIAGNOSIS — J449 Chronic obstructive pulmonary disease, unspecified: Secondary | ICD-10-CM | POA: Diagnosis not present

## 2017-12-20 DIAGNOSIS — Z23 Encounter for immunization: Secondary | ICD-10-CM | POA: Diagnosis not present

## 2017-12-21 DIAGNOSIS — R69 Illness, unspecified: Secondary | ICD-10-CM | POA: Diagnosis not present

## 2017-12-21 DIAGNOSIS — R41841 Cognitive communication deficit: Secondary | ICD-10-CM | POA: Diagnosis not present

## 2017-12-21 DIAGNOSIS — F329 Major depressive disorder, single episode, unspecified: Secondary | ICD-10-CM | POA: Diagnosis not present

## 2017-12-21 DIAGNOSIS — F0280 Dementia in other diseases classified elsewhere without behavioral disturbance: Secondary | ICD-10-CM | POA: Diagnosis not present

## 2017-12-26 DIAGNOSIS — R319 Hematuria, unspecified: Secondary | ICD-10-CM | POA: Diagnosis not present

## 2017-12-26 DIAGNOSIS — N400 Enlarged prostate without lower urinary tract symptoms: Secondary | ICD-10-CM | POA: Diagnosis not present

## 2017-12-26 DIAGNOSIS — N390 Urinary tract infection, site not specified: Secondary | ICD-10-CM | POA: Diagnosis not present

## 2017-12-26 DIAGNOSIS — R8290 Unspecified abnormal findings in urine: Secondary | ICD-10-CM | POA: Diagnosis not present

## 2017-12-27 DIAGNOSIS — I10 Essential (primary) hypertension: Secondary | ICD-10-CM | POA: Diagnosis not present

## 2017-12-28 DIAGNOSIS — R634 Abnormal weight loss: Secondary | ICD-10-CM | POA: Diagnosis not present

## 2017-12-28 DIAGNOSIS — M6281 Muscle weakness (generalized): Secondary | ICD-10-CM | POA: Diagnosis not present

## 2017-12-28 DIAGNOSIS — J449 Chronic obstructive pulmonary disease, unspecified: Secondary | ICD-10-CM | POA: Diagnosis not present

## 2017-12-28 DIAGNOSIS — I10 Essential (primary) hypertension: Secondary | ICD-10-CM | POA: Diagnosis not present

## 2017-12-28 DIAGNOSIS — E785 Hyperlipidemia, unspecified: Secondary | ICD-10-CM | POA: Diagnosis not present

## 2017-12-28 DIAGNOSIS — B351 Tinea unguium: Secondary | ICD-10-CM | POA: Diagnosis not present

## 2017-12-29 DIAGNOSIS — M6281 Muscle weakness (generalized): Secondary | ICD-10-CM | POA: Diagnosis not present

## 2017-12-29 DIAGNOSIS — J441 Chronic obstructive pulmonary disease with (acute) exacerbation: Secondary | ICD-10-CM | POA: Diagnosis not present

## 2017-12-29 DIAGNOSIS — E785 Hyperlipidemia, unspecified: Secondary | ICD-10-CM | POA: Diagnosis not present

## 2017-12-29 DIAGNOSIS — I10 Essential (primary) hypertension: Secondary | ICD-10-CM | POA: Diagnosis not present

## 2018-01-25 DIAGNOSIS — I10 Essential (primary) hypertension: Secondary | ICD-10-CM | POA: Diagnosis not present

## 2018-01-25 DIAGNOSIS — E785 Hyperlipidemia, unspecified: Secondary | ICD-10-CM | POA: Diagnosis not present

## 2018-01-25 DIAGNOSIS — E119 Type 2 diabetes mellitus without complications: Secondary | ICD-10-CM | POA: Diagnosis not present

## 2018-01-25 DIAGNOSIS — J449 Chronic obstructive pulmonary disease, unspecified: Secondary | ICD-10-CM | POA: Diagnosis not present

## 2018-01-25 DIAGNOSIS — M6281 Muscle weakness (generalized): Secondary | ICD-10-CM | POA: Diagnosis not present

## 2018-01-25 DIAGNOSIS — I509 Heart failure, unspecified: Secondary | ICD-10-CM | POA: Diagnosis not present

## 2018-01-25 DIAGNOSIS — H409 Unspecified glaucoma: Secondary | ICD-10-CM | POA: Diagnosis not present

## 2018-01-26 DIAGNOSIS — E119 Type 2 diabetes mellitus without complications: Secondary | ICD-10-CM | POA: Diagnosis not present

## 2018-01-26 DIAGNOSIS — I10 Essential (primary) hypertension: Secondary | ICD-10-CM | POA: Diagnosis not present

## 2018-01-26 DIAGNOSIS — M6281 Muscle weakness (generalized): Secondary | ICD-10-CM | POA: Diagnosis not present

## 2018-01-26 DIAGNOSIS — E785 Hyperlipidemia, unspecified: Secondary | ICD-10-CM | POA: Diagnosis not present

## 2018-01-26 DIAGNOSIS — J449 Chronic obstructive pulmonary disease, unspecified: Secondary | ICD-10-CM | POA: Diagnosis not present

## 2018-01-26 DIAGNOSIS — H409 Unspecified glaucoma: Secondary | ICD-10-CM | POA: Diagnosis not present

## 2018-01-26 DIAGNOSIS — I509 Heart failure, unspecified: Secondary | ICD-10-CM | POA: Diagnosis not present

## 2018-01-31 DIAGNOSIS — E119 Type 2 diabetes mellitus without complications: Secondary | ICD-10-CM | POA: Diagnosis not present

## 2018-01-31 DIAGNOSIS — J449 Chronic obstructive pulmonary disease, unspecified: Secondary | ICD-10-CM | POA: Diagnosis not present

## 2018-01-31 DIAGNOSIS — I509 Heart failure, unspecified: Secondary | ICD-10-CM | POA: Diagnosis not present

## 2018-01-31 DIAGNOSIS — M6281 Muscle weakness (generalized): Secondary | ICD-10-CM | POA: Diagnosis not present

## 2018-01-31 DIAGNOSIS — E785 Hyperlipidemia, unspecified: Secondary | ICD-10-CM | POA: Diagnosis not present

## 2018-01-31 DIAGNOSIS — I10 Essential (primary) hypertension: Secondary | ICD-10-CM | POA: Diagnosis not present

## 2018-01-31 DIAGNOSIS — H409 Unspecified glaucoma: Secondary | ICD-10-CM | POA: Diagnosis not present

## 2018-02-02 DIAGNOSIS — R638 Other symptoms and signs concerning food and fluid intake: Secondary | ICD-10-CM | POA: Diagnosis not present

## 2018-02-02 DIAGNOSIS — R1310 Dysphagia, unspecified: Secondary | ICD-10-CM | POA: Diagnosis not present

## 2018-02-02 DIAGNOSIS — R69 Illness, unspecified: Secondary | ICD-10-CM | POA: Diagnosis not present

## 2018-02-02 DIAGNOSIS — E119 Type 2 diabetes mellitus without complications: Secondary | ICD-10-CM | POA: Diagnosis not present

## 2018-02-02 DIAGNOSIS — Z931 Gastrostomy status: Secondary | ICD-10-CM | POA: Diagnosis not present

## 2018-02-02 DIAGNOSIS — I5021 Acute systolic (congestive) heart failure: Secondary | ICD-10-CM | POA: Diagnosis not present

## 2018-02-02 DIAGNOSIS — Z8673 Personal history of transient ischemic attack (TIA), and cerebral infarction without residual deficits: Secondary | ICD-10-CM | POA: Diagnosis not present

## 2018-02-05 DIAGNOSIS — E119 Type 2 diabetes mellitus without complications: Secondary | ICD-10-CM | POA: Diagnosis not present

## 2018-02-05 DIAGNOSIS — I25810 Atherosclerosis of coronary artery bypass graft(s) without angina pectoris: Secondary | ICD-10-CM | POA: Diagnosis not present

## 2018-02-05 DIAGNOSIS — Z8673 Personal history of transient ischemic attack (TIA), and cerebral infarction without residual deficits: Secondary | ICD-10-CM | POA: Diagnosis not present

## 2018-02-05 DIAGNOSIS — I5021 Acute systolic (congestive) heart failure: Secondary | ICD-10-CM | POA: Diagnosis not present

## 2018-02-05 DIAGNOSIS — Z931 Gastrostomy status: Secondary | ICD-10-CM | POA: Diagnosis not present

## 2018-02-05 DIAGNOSIS — R69 Illness, unspecified: Secondary | ICD-10-CM | POA: Diagnosis not present

## 2018-02-05 DIAGNOSIS — R1310 Dysphagia, unspecified: Secondary | ICD-10-CM | POA: Diagnosis not present

## 2018-02-05 DIAGNOSIS — M6281 Muscle weakness (generalized): Secondary | ICD-10-CM | POA: Diagnosis not present

## 2018-02-05 DIAGNOSIS — R638 Other symptoms and signs concerning food and fluid intake: Secondary | ICD-10-CM | POA: Diagnosis not present

## 2018-02-05 DIAGNOSIS — I69351 Hemiplegia and hemiparesis following cerebral infarction affecting right dominant side: Secondary | ICD-10-CM | POA: Diagnosis not present

## 2018-02-26 DIAGNOSIS — I739 Peripheral vascular disease, unspecified: Secondary | ICD-10-CM | POA: Diagnosis not present

## 2018-02-26 DIAGNOSIS — I10 Essential (primary) hypertension: Secondary | ICD-10-CM | POA: Diagnosis not present

## 2018-02-26 DIAGNOSIS — E119 Type 2 diabetes mellitus without complications: Secondary | ICD-10-CM | POA: Diagnosis not present

## 2018-02-26 DIAGNOSIS — I5021 Acute systolic (congestive) heart failure: Secondary | ICD-10-CM | POA: Diagnosis not present

## 2018-03-01 DIAGNOSIS — I10 Essential (primary) hypertension: Secondary | ICD-10-CM | POA: Diagnosis not present

## 2018-03-01 DIAGNOSIS — R1319 Other dysphagia: Secondary | ICD-10-CM | POA: Diagnosis not present

## 2018-03-02 DIAGNOSIS — I69351 Hemiplegia and hemiparesis following cerebral infarction affecting right dominant side: Secondary | ICD-10-CM | POA: Diagnosis not present

## 2018-03-02 DIAGNOSIS — E119 Type 2 diabetes mellitus without complications: Secondary | ICD-10-CM | POA: Diagnosis not present

## 2018-03-02 DIAGNOSIS — M6281 Muscle weakness (generalized): Secondary | ICD-10-CM | POA: Diagnosis not present

## 2018-03-02 DIAGNOSIS — R69 Illness, unspecified: Secondary | ICD-10-CM | POA: Diagnosis not present

## 2018-03-05 DIAGNOSIS — R109 Unspecified abdominal pain: Secondary | ICD-10-CM | POA: Diagnosis not present

## 2018-03-05 DIAGNOSIS — R799 Abnormal finding of blood chemistry, unspecified: Secondary | ICD-10-CM | POA: Diagnosis not present

## 2018-03-06 DIAGNOSIS — R69 Illness, unspecified: Secondary | ICD-10-CM | POA: Diagnosis not present

## 2018-05-29 IMAGING — CT CT HEAD W/O CM
3 series · 15 of 47 positions shown, 18 images · non-contrast
Comparison: None.

CLINICAL DATA: Dizziness for 3 weeks.

EXAM:
CT HEAD WITHOUT CONTRAST
TECHNIQUE: Contiguous axial images were obtained from the base of the skull
through the vertex without intravenous contrast.

[Series 2: head wo · axial · 0.45mm/px · z∈[-164,-34]mm · 9 of 32 slices shown, 12 images]
[im 3/32  brain]
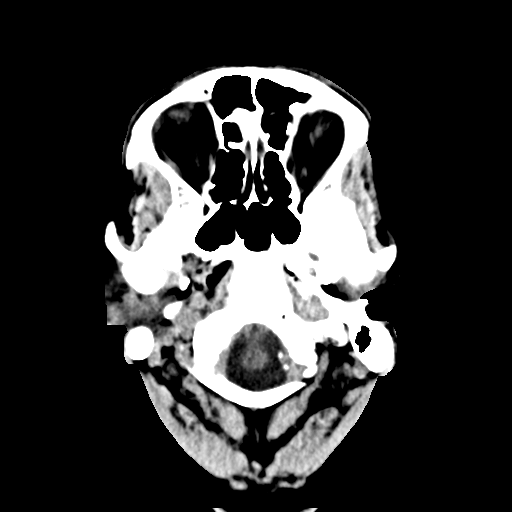
[im 3/32  bone]
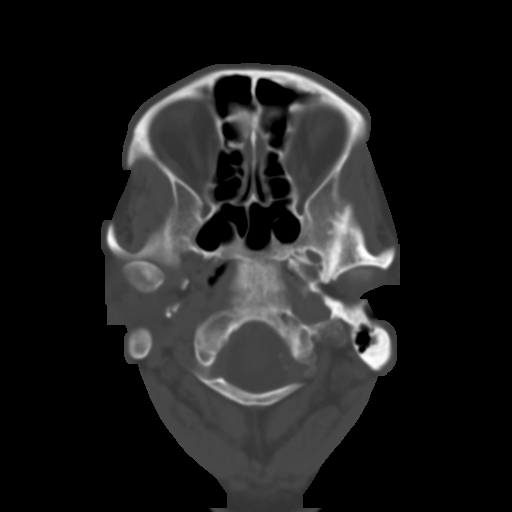
[im 6/32  brain]
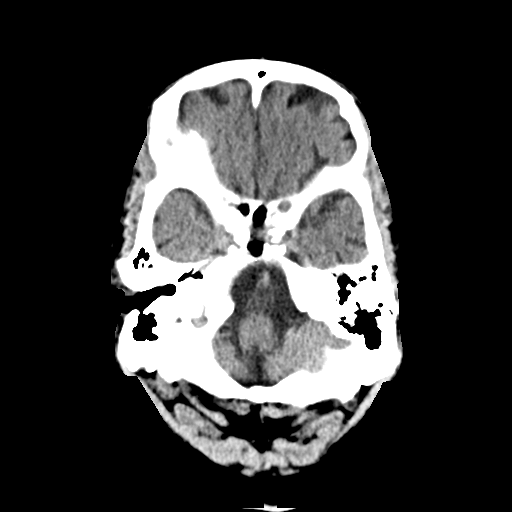
[im 9/32  brain]
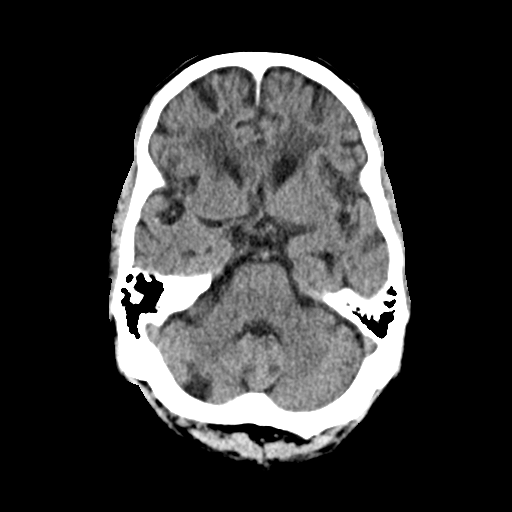
[im 12/32  brain]
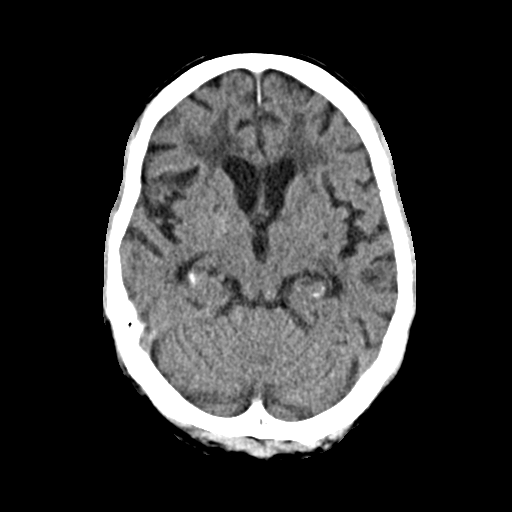
[im 17/32  brain]
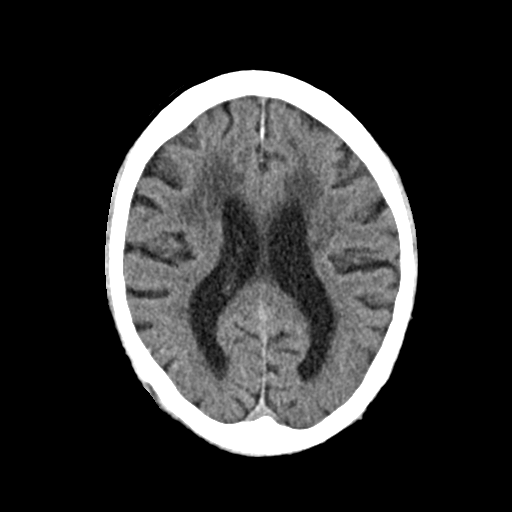
[im 17/32  bone]
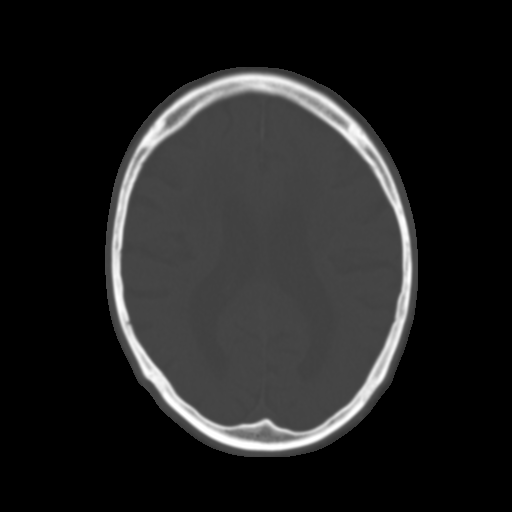
[im 20/32  brain]
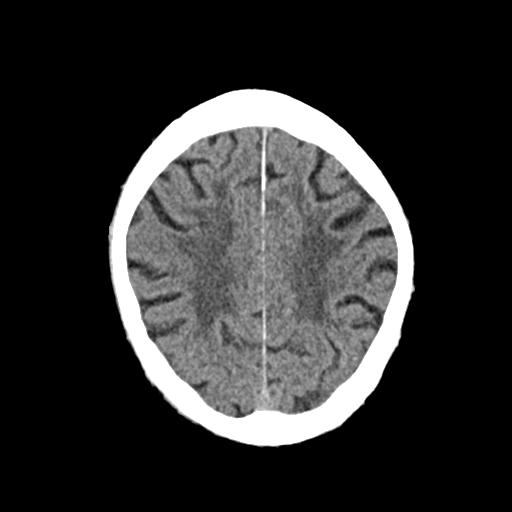
[im 23/32  brain]
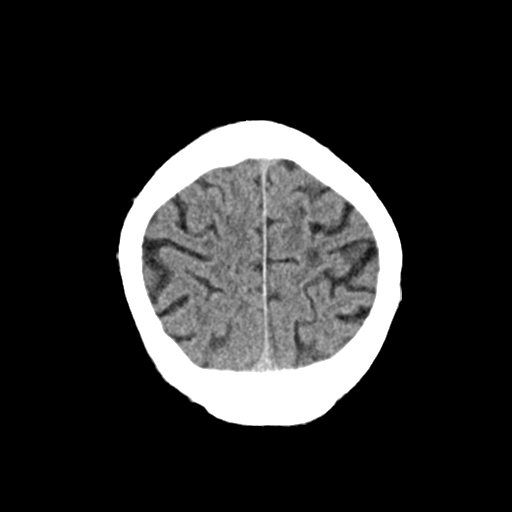
[im 26/32  brain]
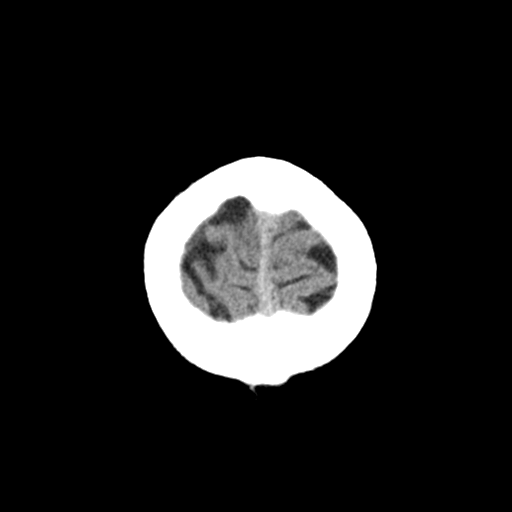
[im 29/32  brain]
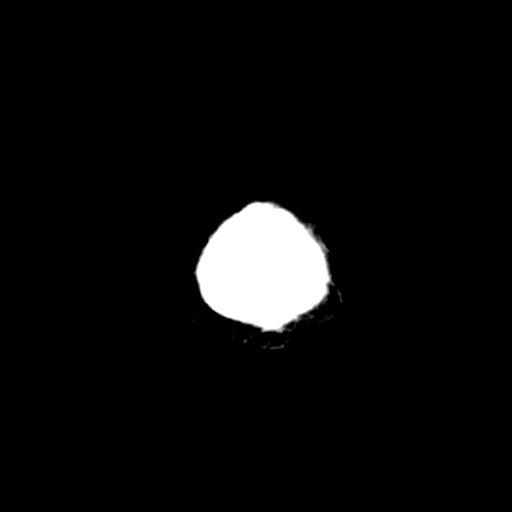
[im 29/32  bone]
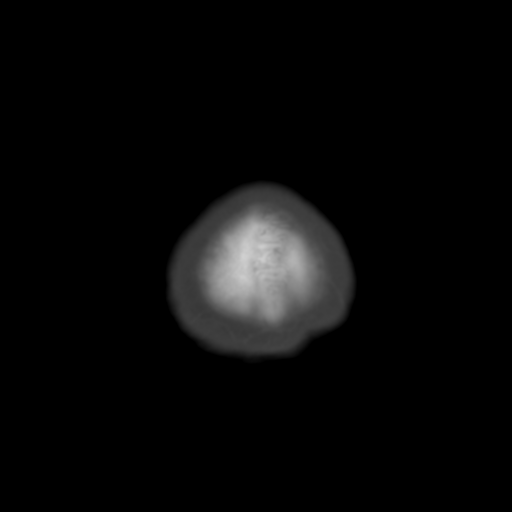

[Series 4: coronal soft · coronal · 0.32mm/px · 3 of 70 slices shown]
[im 24/70  brain]
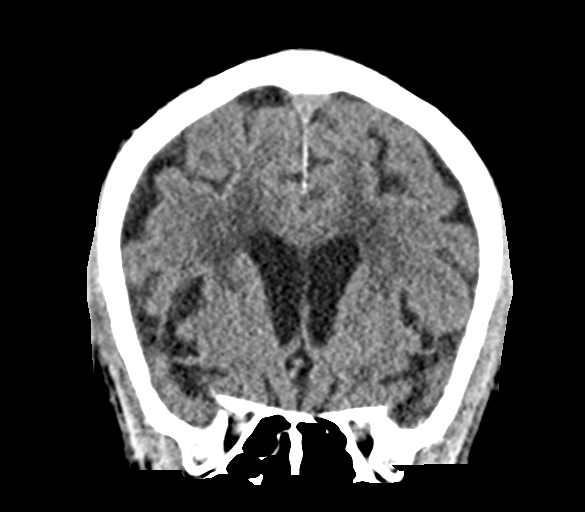
[im 31/70  brain]
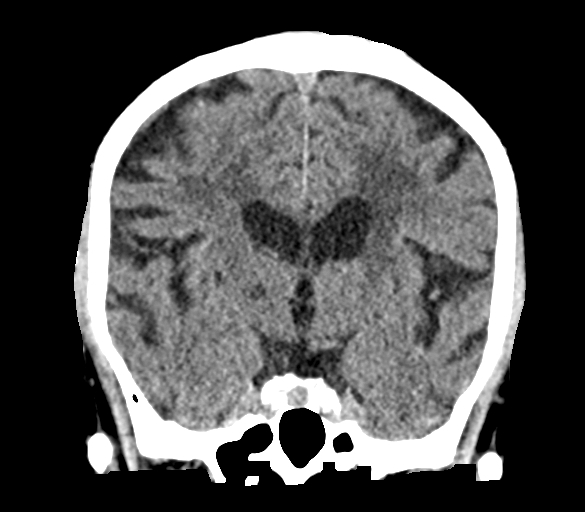
[im 39/70  brain]
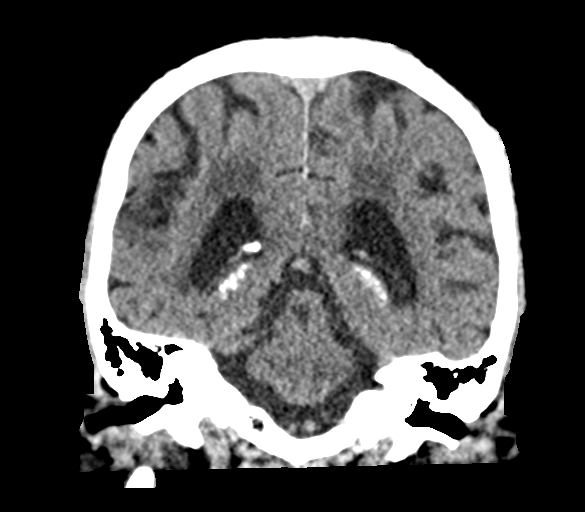

[Series 5: sag soft · sagittal · 0.32mm/px · 3 of 55 slices shown]
[im 19/55  brain]
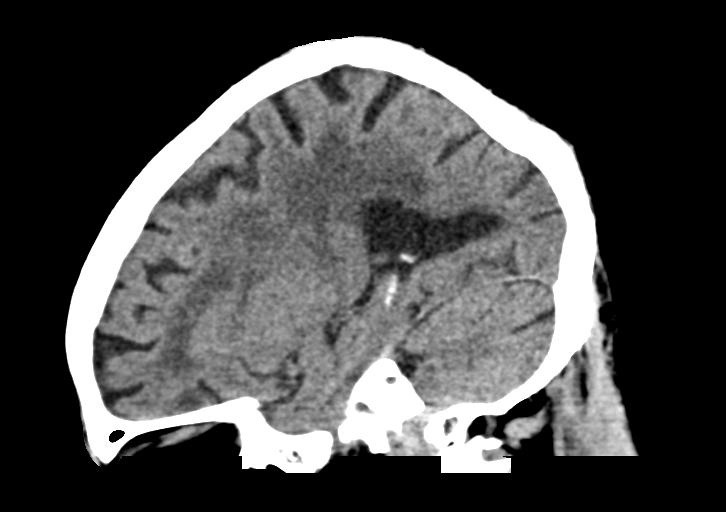
[im 28/55  brain]
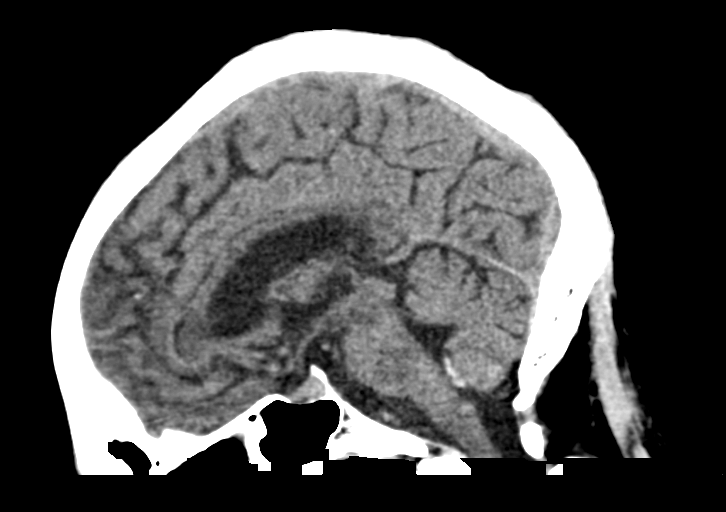
[im 37/55  brain]
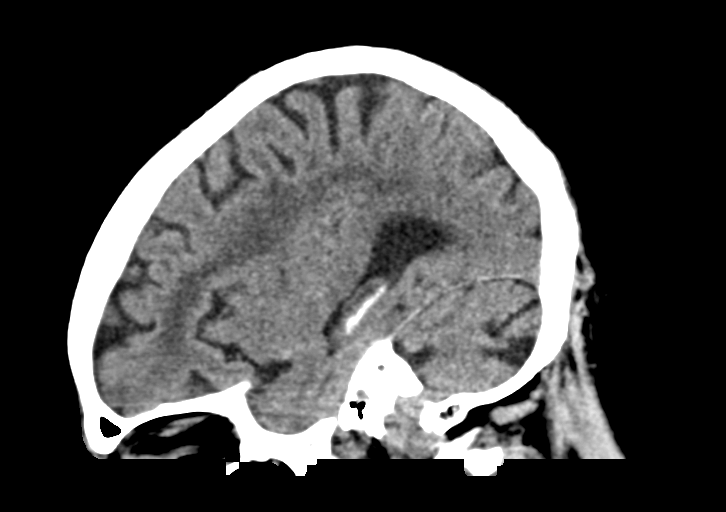

[15 of 47 positions shown; findings below may reference images not displayed]

FINDINGS: Brain: There is cortical atrophy and extensive chronic microvascular
ischemic change. No evidence of acute intracranial abnormality
including hemorrhage, infarct, mass lesion, mass effect, midline
shift or abnormal extra-axial fluid collection. No hydrocephalus or
pneumocephalus.

Vascular: Extensive atherosclerotic vascular disease is identified.

Skull: Intact.

Sinuses/Orbits: Negative.

Other: None.
IMPRESSION: No acute abnormality.

Atrophy and extensive chronic microvascular ischemic change.

Atherosclerosis.
